# Patient Record
Sex: Female | Born: 1985 | Race: Black or African American | Hispanic: No | Marital: Married | Smoking: Never smoker
Health system: Southern US, Community
[De-identification: ages and names within clinical notes are randomized; demographics above are authoritative.]

## PROBLEM LIST (undated history)

## (undated) DIAGNOSIS — D649 Anemia, unspecified: Secondary | ICD-10-CM

## (undated) HISTORY — DX: Anemia, unspecified: D64.9

## (undated) HISTORY — PX: LEEP: SHX91

---

## 2014-03-30 DIAGNOSIS — O30009 Twin pregnancy, unspecified number of placenta and unspecified number of amniotic sacs, unspecified trimester: Secondary | ICD-10-CM | POA: Insufficient documentation

## 2017-11-24 ENCOUNTER — Ambulatory Visit: Payer: No Typology Code available for payment source | Attending: Family Medicine | Admitting: Physical Therapy

## 2017-11-24 ENCOUNTER — Encounter: Payer: Self-pay | Admitting: Physical Therapy

## 2017-11-24 ENCOUNTER — Other Ambulatory Visit: Payer: Self-pay

## 2017-11-24 DIAGNOSIS — R29898 Other symptoms and signs involving the musculoskeletal system: Secondary | ICD-10-CM | POA: Diagnosis present

## 2017-11-24 DIAGNOSIS — R278 Other lack of coordination: Secondary | ICD-10-CM | POA: Diagnosis not present

## 2017-11-24 DIAGNOSIS — M6281 Muscle weakness (generalized): Secondary | ICD-10-CM | POA: Insufficient documentation
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
## 2017-11-24 NOTE — Patient Instructions (Addendum)
  Decrease downward forces onto pelvic floor: Log roll out of bed instead of jumping out of bed with a crunch Hold off on crunches and sit ups  Exhale as you lift or as you get out of the chair (against gravity and loads)      Proper body mechanics with getting out of a chair to decrease strain  on back &pelvic floor   Avoid holding your breath when Getting out of the chair:  Scoot to front part of chair chair Heels behind feet, feet are hip width apart, nose over toes  Inhale like you are smelling roses Exhale to stand       Deep core strengthenign Level 1 and 2 ( handout)

## 2017-11-30 ENCOUNTER — Ambulatory Visit: Payer: No Typology Code available for payment source | Admitting: Physical Therapy

## 2017-11-30 DIAGNOSIS — M6281 Muscle weakness (generalized): Secondary | ICD-10-CM | POA: Diagnosis not present

## 2017-11-30 DIAGNOSIS — R29898 Other symptoms and signs involving the musculoskeletal system: Secondary | ICD-10-CM

## 2017-11-30 NOTE — Patient Instructions (Signed)
   Abdominal massage upward from L,  R , center to belly button 3 stroke x 3 , pressure is gentle and light with all fingers flat, not using finger tips   Open book ( handout)   Deep core level 1 ( focus on the natural lift of pelvic floor with exhale)  Deep core level 2   LOG ROLL TO SIDE COMPLETELY BEFORE OUT OF BED

## 2017-12-01 NOTE — Therapy (Addendum)
[place] [facility] [address] [place], [place], [address] Phone: [phone]   Fax:  [phone]  Physical Therapy Treatment  Patient Details  Name: Jennifer FellowsVanity Drake MRN: [id number] Date of Birth: May 14, 1985 Referring Provider: Tiney RougeKeely Godwin   Encounter Date: 11/30/2017  PT End of Session - 12/01/17 2239    Visit Number  2    Number of Visits  8    Date for PT Re-Evaluation  01/19/18    Authorization Type  VA authorization 15 visits before 01/24/18     PT Start Time  1600    PT Stop Time  1702    PT Time Calculation (min)  62 min       No past medical history on file.  Past Surgical History:  Procedure Laterality Date  . CESAREAN SECTION    . LEEP      There were no vitals filed for this visit.  Subjective Assessment - 11/30/17 1610    Subjective  Pt is working on breaking her old habits , rolling out of the bed and standing up.     Pertinent History  Hx of pull-ups, sit-ups, crunches. Pt has stopped performing her exercise routine. Denied injuries to hips,spine, and legs. Pt has 4 children, ( 389, 885 and 30 years old twins) .  2 vaginal deliveries with stitches  and 1 c -sections for twins.      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    Pelvic Floor Internal Exam  pt consented verbally without contraindications     Exam Type  Vaginal    Palpation  decreased activation on R pelvic floor, bearing down with abs, intitally superficial mm activation, post Tx achieved 4/5 but decreased activation on R     Strength  good  squeeze, good lift, able to hold agaisnt strong resistance    Strength # of reps  10    Strength # of seconds  1        [facility] Adult PT Treatment/Exercise - 12/01/17 2239      Neuro Re-ed    Neuro Re-ed Details   see pt instructions      Manual Therapy   Manual therapy comments  fascial release over R mons pubis/ bulbospongiosus,  quadriped position: fascial pulling of obliques to faciliate closure of DRA , fsacial release over C-section scar     Internal Pelvic Floor  facilitation of anterior mm R > L                  PT Long Term Goals - 11/24/17 1631      PT LONG TERM GOAL #1   Title  Pt will report less pain < 1/10 and will use proper lfitng. bending mechanics  when bending and lifting both toddler twins out of the bathroom in order to perform mother duties     Time  4    Period  Weeks    Status  New    Target Date  12/22/17      PT LONG TERM GOAL #2   Title  Pt will be able to cook for 30 mins without back pain in order to return ADLs     Time  8    Period  Weeks    Status  New    Target Date  01/19/18      PT LONG TERM GOAL #3   Title  Pt will IND with modifications and techniques with fitness routines  to ensure proper dep core activation and proper alignment to minimize injuries     Time  8    Period  Weeks    Status  New    Target Date  01/19/18      PT LONG TERM GOAL #4   Title  Pt will report improved stool consistency type will improve from type 1 ( 75%) to Type 4 ( 75%) in order to minimize straining of the pelvic floor    Time  8    Period  Weeks    Status  New    Target Date  01/19/18      PT LONG TERM GOAL #5   Title  Pt will decrease her PFDI score from 51% to <31 % in order to have less leakage and less difficulty with emptying bladder     Time  8    Period  Weeks    Status  New    Target Date  01/19/18      Additional Long Term Goals   Additional Long Term Goals  Yes      PT LONG TERM GOAL #6   Title  Pt will decrease her PDI  score from 13% to <8 % in order to minimize LBP and pelvic pain to return to ADLs.     Time  8    Period  Weeks    Status  New    Target Date  01/19/18      PT LONG TERM GOAL #7   Title  Pt will demo less fingers width with diastasis recti from 4 fingers to < 2 fingers in order to increased intraabdominal pressure to optimize motility for better bowel movements, postural stability for bending. lifting,     Time  8    Period  Weeks    Status  New    Target Date  01/19/18      PT LONG TERM GOAL #8   Title  Pt will decrease her ODI score from 23% to < 18% in order to minimize LBP and return to fitness     Time  8    Period  Weeks    Status  New    Target Date  01/19/18            Plan - 12/01/17 2242    Clinical Impression Statement  Pt demo'd decreased C-section scar restrictions R> L along with tightness at R bulbospongiosus, B anterior pelvic floor mm following Tx today. Pt had no complaints with Tx. Applied manual Tx to address DRA today which pt also tolerated without complaints. Pt demo'd improve pelvic floor mobility and proper deep core coordination postTx. Pt reported her back felt better with the Tx. Pt continues to benefit from skilled PT to make progress towards her goals.     Rehab Potential  Good    PT Frequency  2x / week   8weeks  PT Duration    PT Treatment/Interventions  Stair training;Functional mobility training;Moist Heat;Gait training;Therapeutic activities;Therapeutic exercise;Patient/family education;Neuromuscular re-education;Manual techniques;Energy conservation;Scar mobilization    Consulted and Agree with Plan of Care  Patient       Patient will benefit from skilled therapeutic intervention in order to improve the following deficits and impairments:  Improper body mechanics, Pain, Increased fascial restricitons, Increased muscle spasms, Postural dysfunction, Decreased scar mobility, Decreased mobility, Decreased coordination, Decreased activity  tolerance, Decreased endurance, Decreased range of motion, Impaired flexibility, Decreased balance, Decreased strength  Visit Diagnosis: Muscle weakness (generalized)  Other symptoms and signs involving the musculoskeletal system     Problem List There are no active problems to display for this patient.   Mariane Masters ,PT, DPT, E-RYT  12/01/2017, 10:46 PM  [facility] MAIN [facility] SERVICES [address] [place], [place], [address] Phone: [phone]   Fax:  [phone]  Name: Jennifer Drake MRN: [id number] Date of Birth: 1986/01/13

## 2017-12-02 ENCOUNTER — Ambulatory Visit: Payer: No Typology Code available for payment source | Admitting: Physical Therapy

## 2017-12-02 DIAGNOSIS — M6281 Muscle weakness (generalized): Secondary | ICD-10-CM | POA: Diagnosis not present

## 2017-12-02 DIAGNOSIS — R29898 Other symptoms and signs involving the musculoskeletal system: Secondary | ICD-10-CM

## 2017-12-02 NOTE — Therapy (Signed)
[place] [facility] [address] [place], [place], [address] Phone: [phone]   Fax:  [phone]  Physical Therapy Treatment  Patient Details  Name: Jennifer Drake MRN: [id number] Date of Birth: 08/11/1985 Referring Provider: Tiney RougeKeely Godwin   Encounter Date: 12/02/2017  PT End of Session - 12/02/17 1802    Visit Number  3    Number of Visits  8    Date for PT Re-Evaluation  01/19/18    Authorization Type  VA authorization 15 visits before 01/24/18     PT Start Time  1707    PT Stop Time  1802    PT Time Calculation (min)  55 min       No past medical history on file.  Past Surgical History:  Procedure Laterality Date  . CESAREAN SECTION    . LEEP      There were no vitals filed for this visit.  Subjective Assessment - 12/02/17 1721    Subjective  Pt reported she felt pain over the abdominal for several hours after the treatment at last session. It has subsided today. Pt tried the logrolling technique but has questions. Pt has difficulty falling asleep since her father died in May 2019. Pt uses her phone to fall sleep and takes melatonin. Pt finds her mind thinking and over thinking which keeps her up until 3am or later. Since she was a kid, she has had episodes where she wakes but her body can not move and her eyes won't open, her mind is awake.  It occurs once a month or less. Pt has not spoken to her MD about it yet. Pt reports feeling sleepy today     Pertinent History  Hx of pull-ups, sit-ups, crunches. Pt has stopped performing her exercise routine. Denied injuries to hips,spine, and legs. Pt has 4 children, ( 839, 85 and 30 years old twins) .  2 vaginal deliveries with stitches  and 1 c -sections for twins.      Patient Stated Goals  pain to subside and pelvic floor to get stronger and want to know more about diastasis recti          [facility] PT Assessment - 12/02/17 1807      Palpation   Palpation comment  tightness at  posterior/ anterior/ lateral  intercostals B       Bed Mobility   Bed Mobility  -- poor carry over, required 4 reps                 Pelvic Floor Special Questions - 12/02/17 1807    Diastasis Recti  903fingers width aboev umbilicus, 1 fingers width below sternum        [facility] Adult PT Treatment/Exercise - 12/02/17 1807      Therapeutic Activites    Therapeutic Activities  -- see pt instructions, sleep hygiene with relaxation technique      Manual Therapy   Manual therapy comments  fascial release over lower ribs for improve depression and excursion of ribcage for DRA, STM at posterior intercostals               PT Education - 12/02/17 1749    Education Details  HEP    Person(s) Educated  Patient    Methods  Explanation;Demonstration;Verbal cues;Handout    Comprehension  Returned demonstration;Verbalized understanding          PT Long Term Goals - 12/02/17 1803      PT LONG TERM GOAL #  1   Title  Pt will report less pain < 1/10 and will use proper lfitng. bending mechanics  when bending and lifting both toddler twins out of the bathroom in order to perform mother duties     Time  4    Period  Weeks    Status  New      PT LONG TERM GOAL #2   Title  Pt will be able to cook for 30 mins without back pain in order to return ADLs     Time  8    Period  Weeks    Status  New      PT LONG TERM GOAL #3   Title  Pt will IND with modifications and techniques with fitness routines  to ensure proper dep core activation and proper alignment to minimize injuries     Time  8    Period  Weeks    Status  New      PT LONG TERM GOAL #4   Title  Pt will report improved stool consistency type will improve from type 1 ( 75%) to Type 4 ( 75%) in order to minimize straining of the pelvic floor    Time  8    Period  Weeks    Status  New      PT LONG TERM GOAL #5   Title  Pt will decrease her PFDI score from 51% to <31 % in order to have less leakage and less difficulty with  emptying bladder     Time  8    Period  Weeks    Status  New      Additional Long Term Goals   Additional Long Term Goals  Yes      PT LONG TERM GOAL #6   Title  Pt will decrease her PDI score from 13% to <8 % in order to minimize LBP and pelvic pain to return to ADLs.     Time  8    Period  Weeks    Status  New      PT LONG TERM GOAL #7   Title  Pt will demo less fingers width with diastasis recti from 4 fingers to < 2 fingers in order to increased intraabdominal pressure to optimize motility for better bowel movements, postural stability for bending. lifting,     Time  8    Period  Weeks    Status  New      PT LONG TERM GOAL #8   Title  Pt will decrease her ODI score from 23% to < 18% in order to minimize LBP and return to fitness     Time  8    Period  Weeks    Status  New      PT LONG TERM GOAL  #9   TITLE  Pt will decrease her PSQI score from 90% to < 50% in order to improve sleep quality for improved outcomes for learning at PT sessions and overall health    Time  12    Period  Weeks    Status  New    Target Date  02/24/18            Plan - 12/02/17 1803    Clinical Impression Statement  Pt showed less bulging of abdominal mm today and less separation of DRA. Applied manual Tx to facilitate more diaphragmatic excursion and depression.  Pt continued to show improved coordination of deep core mm. Added relaxation and sleep  hygiene education to promote sleep quality and overall wellness. Advised pt to contact her PCP about her questions and concerns about getting sleep study to screen for OSA. Pt voiced understanding. Pt responded well to relaxation training as pt reported feeling more relaxed like she could fall sleep. Pt was recommended to utilize pelvic PT exercise sas pre bedtime and morning routine. Pt continues to benefit from skilled PT.       Rehab Potential  Good    PT Frequency  2x / week    PT Duration 8weeks    PT Treatment/Interventions  Stair  training;Functional mobility training;Moist Heat;Gait training;Therapeutic activities;Therapeutic exercise;Patient/family education;Neuromuscular re-education;Manual techniques;Energy conservation;Scar mobilization    Consulted and Agree with Plan of Care  Patient       Patient will benefit from skilled therapeutic intervention in order to improve the following deficits and impairments:  Improper body mechanics, Pain, Increased fascial restricitons, Increased muscle spasms, Postural dysfunction, Decreased scar mobility, Decreased mobility, Decreased coordination, Decreased activity tolerance, Decreased endurance, Decreased range of motion, Impaired flexibility, Decreased balance, Decreased strength  Visit Diagnosis: Muscle weakness (generalized)  Other symptoms and signs involving the musculoskeletal system     Problem List There are no active problems to display for this patient.   Mariane Masters ,PT, DPT, E-RYT  12/02/2017, 6:15 PM  [place] [facility] MAIN [facility] SERVICES [address] [place], [place], [address] Phone: [phone]   Fax:  [phone]  Name: Jennifer Drake MRN: [id number] Date of Birth: 10-17-1985

## 2017-12-02 NOTE — Patient Instructions (Addendum)
To improve sleep hygiene  Discontinue the use of phone and TV 2 hours before bed   Morning and night time routine  DoLast session's exercises Open book ( drag and not lift to avoid neck tightness)  deepcore level 1 and 2   Handout for gentle movements through the body   Body scan ( audio file emailed)

## 2017-12-02 NOTE — Addendum Note (Signed)
Addended by: Mariane MastersYEUNG, SHIN-YIING on: 12/02/2017 04:34 PM   Modules accepted: Orders

## 2017-12-07 ENCOUNTER — Ambulatory Visit: Payer: No Typology Code available for payment source | Admitting: Physical Therapy

## 2017-12-07 DIAGNOSIS — M6281 Muscle weakness (generalized): Secondary | ICD-10-CM

## 2017-12-07 DIAGNOSIS — R29898 Other symptoms and signs involving the musculoskeletal system: Secondary | ICD-10-CM

## 2017-12-07 NOTE — Patient Instructions (Addendum)
Loosening up midback  Open book   Strengthening hips  Clam Shell 45 Degrees   Lying with hips and knees bent 45, one pillow between knees and ankles. Lift knee with exhale. Be sure pelvis does not roll backward. Do not arch back. Do 10 times, each leg, 2 times per day.  http://ss.exer.us/75   Copyright  VHI. All rights reserved.     Complimentary stretch: Figure -4 stretch 5 breaths    _________  Deep core 1 Refine Deep core level 1 breathing with not letting stomach muscle push down, just allow pelvic floor lift , belly sink, chest sink  Inhale 1-2 pause Exhale 2-1 -pause Quick pelvic floor  squeeze after exhale   Deep core level 2  -6 min  __________   Stretches for back :  childs pose -3 ways   at the counter - 3ways  5 reps   Stretch for the neck: Pull the arm down towards feet, turn neck opposite side  5 reps   ____________  Urination:  Apply breathing technique to relax pelvic floor  but donot stop midstream

## 2017-12-08 NOTE — Therapy (Signed)
[place] [facility] MAIN [facility] SERVICES [address] [place], [place], [address] Phone: [phone]   Fax:  [phone]  Physical Therapy Treatment  Patient Details  Name: Jennifer Drake MRN: [id number] Date of Birth: [birth year] Referring Provider: Tiney Rouge   Encounter Date: 12/07/2017    No past medical history on file.  Past Surgical History:  Procedure Laterality Date  . CESAREAN SECTION    . LEEP      There were no vitals filed for this visit.  Subjective Assessment - 12/07/17 1609    Subjective  Pt tried doing her exercises twice the past week. Pt had stopped doing the exercises from a app " Lose Belly Fat". Pt would like to return to doing the elliptical . Pt is still having difficulty emptying all the way with urination     Pertinent History  Hx of pull-ups, sit-ups, crunches. Pt has stopped performing her exercise routine. Denied injuries to hips,spine, and legs. Pt has 4 children, ( 21, 35 and 40 years old twins) .  2 vaginal deliveries with stitches  and 1 c -sections for twins.      Patient Stated Goals  pain to subside and pelvic floor to get stronger and want to know more about diastasis recti          [facility] PT Assessment - 12/08/17 0914      Coordination   Gross Motor Movements are Fluid and Coordinated  -- oblique overuse w/ exhalation, bearing down of pelvic floor      Palpation   Palpation comment  increased tensions at B paraspinals, posterior intercostals  upper trap/ levator                 Pelvic Floor Special Questions - 12/08/17 0915    Diastasis Recti  2.5 fingers above umbilicus, 1 finger below sternum          [facility] Adult PT Treatment/Exercise - 12/08/17 0915      Neuro Re-ed    Neuro Re-ed Details   see pt instructions      Manual Therapy   Manual therapy comments  L T10/12 posterior intercostal mm STM and paraspinal STM B and upper trap/ levator B              PT Education - 12/07/17 1704     Education Details  HEP    Person(s) Educated  Patient    Methods  Explanation;Demonstration;Tactile cues;Verbal cues;Handout    Comprehension  Returned demonstration;Verbalized understanding;Verbal cues required          PT Long Term Goals - 12/02/17 1803      PT LONG TERM GOAL #1   Title  Pt will report less pain < 1/10 and will use proper lfitng. bending mechanics  when bending and lifting both toddler twins out of the bathroom in order to perform mother duties     Time  4    Period  Weeks    Status  New      PT LONG TERM GOAL #2   Title  Pt will be able to cook for 30 mins without back pain in order to return ADLs     Time  8    Period  Weeks    Status  New      PT LONG TERM GOAL #3   Title  Pt will IND with modifications and techniques with fitness routines  to ensure proper dep core activation and proper alignment to minimize injuries  Time  8    Period  Weeks    Status  New      PT LONG TERM GOAL #4   Title  Pt will report improved stool consistency type will improve from type 1 ( 75%) to Type 4 ( 75%) in order to minimize straining of the pelvic floor    Time  8    Period  Weeks    Status  New      PT LONG TERM GOAL #5   Title  Pt will decrease her PFDI score from 51% to <31 % in order to have less leakage and less difficulty with emptying bladder     Time  8    Period  Weeks    Status  New      Additional Long Term Goals   Additional Long Term Goals  Yes      PT LONG TERM GOAL #6   Title  Pt will decrease her PDI score from 13% to <8 % in order to minimize LBP and pelvic pain to return to ADLs.     Time  8    Period  Weeks    Status  New      PT LONG TERM GOAL #7   Title  Pt will demo less fingers width with diastasis recti from 4 fingers to < 2 fingers in order to increased intraabdominal pressure to optimize motility for better bowel movements, postural stability for bending. lifting,     Time  8    Period  Weeks    Status  New      PT LONG  TERM GOAL #8   Title  Pt will decrease her ODI score from 23% to < 18% in order to minimize LBP and return to fitness     Time  8    Period  Weeks    Status  New      PT LONG TERM GOAL  #9   TITLE  Pt will decrease her PSQI score from 90% to < 50% in order to improve sleep quality for improved outcomes for learning at PT sessions and overall health    Time  12    Period  Weeks    Status  New    Target Date  02/24/18            Plan - 12/08/17 0916    Clinical Impression Statement  Pt continues to make progress with less separation of diastasis recti. Progressed with decreasing mm tensions at the thoracic region and hip strengthening HEP. Pt required more cues to minimize overuse of oblique mm with exhalation and minimize downward forces onto pelvic floor and organs. Pt demo'd correct technique post training with verbal , tactile , and visual cues,  Anticipate pt's corrected technique will help with pt's difficulty with completely emptying of urine and lowered position of her bladder.  Pt continues to benefit from skilled PT.     Rehab Potential  Good    PT Frequency  2x / week    PT Duration  8 weeks    PT Treatment/Interventions  Stair training;Functional mobility training;Moist Heat;Gait training;Therapeutic activities;Therapeutic exercise;Patient/family education;Neuromuscular re-education;Manual techniques;Energy conservation;Scar mobilization    Consulted and Agree with Plan of Care  Patient       Patient will benefit from skilled therapeutic intervention in order to improve the following deficits and impairments:  Improper body mechanics, Pain, Increased fascial restricitons, Increased muscle spasms, Postural dysfunction, Decreased scar mobility, Decreased mobility, Decreased  coordination, Decreased activity tolerance, Decreased endurance, Decreased range of motion, Impaired flexibility, Decreased balance, Decreased strength  Visit Diagnosis: Muscle weakness  (generalized)  Other symptoms and signs involving the musculoskeletal system     Problem List There are no active problems to display for this patient.   Mariane Masters 12/08/2017, 9:20 AM  [place] [facility] MAIN [facility] SERVICES [address] [place], [place], [address] Phone: [phone]   Fax:  [phone]  Name: Jennifer Drake MRN: [id number] Date of Birth: 1985/11/25

## 2017-12-08 NOTE — Therapy (Signed)
[place] [facility] MAIN [facility] SERVICES [address] [place], [place], [address] Phone: [phone]   Fax:  [phone]  Physical Therapy Treatment  Patient Details  Name: Jennifer Drake MRN: [id number] Date of Birth: 19-Oct-1985 Referring Provider: Tiney Rouge   Encounter Date: 12/07/2017    No past medical history on file.  Past Surgical History:  Procedure Laterality Date  . CESAREAN SECTION    . LEEP      There were no vitals filed for this visit.  Subjective Assessment - 12/07/17 1609    Subjective  Pt tried doing her exercises twice the past week. Pt had stopped doing the exercises from a app " Lose Belly Fat". Pt would like to return to doing the elliptical . Pt is still having difficulty emptying all the way with urination     Pertinent History  Hx of pull-ups, sit-ups, crunches. Pt has stopped performing her exercise routine. Denied injuries to hips,spine, and legs. Pt has 4 children, ( 74, 81 and 80 years old twins) .  2 vaginal deliveries with stitches  and 1 c -sections for twins.      Patient Stated Goals  pain to subside and pelvic floor to get stronger and want to know more about diastasis recti          [facility] PT Assessment - 12/08/17 0914      Coordination   Gross Motor Movements are Fluid and Coordinated  -- oblique overuse w/ exhalation, bearing down of pelvic floor      Palpation   Palpation comment  increased tensions at B paraspinals, posterior intercostals  upper trap/ levator                 Pelvic Floor Special Questions - 12/08/17 0915    Diastasis Recti  2.5 fingers above umbilicus, 1 finger below sternum          [facility] Adult PT Treatment/Exercise - 12/08/17 0915      Neuro Re-ed    Neuro Re-ed Details   see pt instructions      Manual Therapy   Manual therapy comments  L T10/12 posterior intercostal mm STM and paraspinal STM B and upper trap/ levator B              PT Education - 12/07/17 1704     Education Details  HEP    Person(s) Educated  Patient    Methods  Explanation;Demonstration;Tactile cues;Verbal cues;Handout    Comprehension  Returned demonstration;Verbalized understanding;Verbal cues required          PT Long Term Goals - 12/02/17 1803      PT LONG TERM GOAL #1   Title  Pt will report less pain < 1/10 and will use proper lfitng. bending mechanics  when bending and lifting both toddler twins out of the bathroom in order to perform mother duties     Time  4    Period  Weeks    Status  New      PT LONG TERM GOAL #2   Title  Pt will be able to cook for 30 mins without back pain in order to return ADLs     Time  8    Period  Weeks    Status  New      PT LONG TERM GOAL #3   Title  Pt will IND with modifications and techniques with fitness routines  to ensure proper dep core activation and proper alignment to minimize injuries  Time  8    Period  Weeks    Status  New      PT LONG TERM GOAL #4   Title  Pt will report improved stool consistency type will improve from type 1 ( 75%) to Type 4 ( 75%) in order to minimize straining of the pelvic floor    Time  8    Period  Weeks    Status  New      PT LONG TERM GOAL #5   Title  Pt will decrease her PFDI score from 51% to <31 % in order to have less leakage and less difficulty with emptying bladder     Time  8    Period  Weeks    Status  New      Additional Long Term Goals   Additional Long Term Goals  Yes      PT LONG TERM GOAL #6   Title  Pt will decrease her PDI score from 13% to <8 % in order to minimize LBP and pelvic pain to return to ADLs.     Time  8    Period  Weeks    Status  New      PT LONG TERM GOAL #7   Title  Pt will demo less fingers width with diastasis recti from 4 fingers to < 2 fingers in order to increased intraabdominal pressure to optimize motility for better bowel movements, postural stability for bending. lifting,     Time  8    Period  Weeks    Status  New      PT LONG  TERM GOAL #8   Title  Pt will decrease her ODI score from 23% to < 18% in order to minimize LBP and return to fitness     Time  8    Period  Weeks    Status  New      PT LONG TERM GOAL  #9   TITLE  Pt will decrease her PSQI score from 90% to < 50% in order to improve sleep quality for improved outcomes for learning at PT sessions and overall health    Time  12    Period  Weeks    Status  New    Target Date  02/24/18            Plan - 12/08/17 0916    Clinical Impression Statement  Pt continues to make progress with less separation of diastasis recti. Progressed with decreasing mm tensions at the thoracic region and hip strengthening HEP. manual Tx was applied to minimize pt's neck complaints with open book exercise.  Pt was able to perform HEP without complaints. Pt required more cues to minimize overuse of oblique mm with exhalation and minimize downward forces onto pelvic floor and organs. Pt demo'd correct technique post training with verbal , tactile , and visual cues,  Anticipate pt's corrected technique will help with pt's difficulty with completely emptying of urine and lowered position of her bladder.  Pt continues to benefit from skilled PT.     Rehab Potential  Good    PT Frequency  2x / week    PT Duration  8 weeks    PT Treatment/Interventions  Stair training;Functional mobility training;Moist Heat;Gait training;Therapeutic activities;Therapeutic exercise;Patient/family education;Neuromuscular re-education;Manual techniques;Energy conservation;Scar mobilization    Consulted and Agree with Plan of Care  Patient       Patient will benefit from skilled therapeutic intervention in order to improve the following  deficits and impairments:  Improper body mechanics, Pain, Increased fascial restricitons, Increased muscle spasms, Postural dysfunction, Decreased scar mobility, Decreased mobility, Decreased coordination, Decreased activity tolerance, Decreased endurance, Decreased range  of motion, Impaired flexibility, Decreased balance, Decreased strength  Visit Diagnosis: Muscle weakness (generalized)  Other symptoms and signs involving the musculoskeletal system     Problem List There are no active problems to display for this patient.   Mariane Masters 12/08/2017, 9:20 AM  [place] [facility] MAIN [facility] SERVICES [address] [place], [place], [address] Phone: [phone]   Fax:  [phone]  Name: Anola Mcgough MRN: [id number] Date of Birth: 1985/07/29

## 2017-12-09 ENCOUNTER — Ambulatory Visit: Payer: No Typology Code available for payment source | Admitting: Physical Therapy

## 2017-12-14 ENCOUNTER — Ambulatory Visit: Payer: No Typology Code available for payment source | Admitting: Physical Therapy

## 2017-12-16 ENCOUNTER — Ambulatory Visit: Payer: No Typology Code available for payment source | Attending: Family Medicine | Admitting: Physical Therapy

## 2017-12-16 DIAGNOSIS — R29898 Other symptoms and signs involving the musculoskeletal system: Secondary | ICD-10-CM | POA: Diagnosis present

## 2017-12-16 DIAGNOSIS — M6281 Muscle weakness (generalized): Secondary | ICD-10-CM | POA: Diagnosis not present

## 2017-12-16 NOTE — Patient Instructions (Addendum)
Standing posture: soft and unlocked knees when standing  Exhale to feel the belly hug in Do not hold your stomach in all day    "Pulling seat belt"   Band placed in a "U" on the floor,  Feet on band,  Hold R band in L hand,  Keep knees parallel hip width apart,  Upper arms firm, keep elbow by side Keep wrist position neutral when pulling  Exhale to pull bandcross body 10 x 2 reps    _________   Modified thread the needle: hands at the counter, trunk parallel. Knees slightly bent Lengthen spine, 3 breaths. Then R hand on L thigh 3 breaths  Switch sides    _________   Sideplank modified with calm shells  Forearm/ fist on the floor elbow slightly ahead of shoulder,  Elbow, hips, heels all aligned Other hand on floor for support Make sure both sides of the trunk stay lengthened Thighs are at 120 deg from trunk, heels in line with tailbone Inhale, exhale, lift knee up slightly before the point of rocking hips back 10 x   Switch sides

## 2017-12-17 NOTE — Therapy (Addendum)
[place] MAIN [facility] SERVICES [address] [place], [place], [address] Phone: [phone]   Fax:  [phone]  Physical Therapy Treatment  Patient Details  Name: Jennifer Drake MRN: [id number] Date of Birth: 04-02-1986 Referring Provider: Jetta Lout   Encounter Date: 12/16/2017  PT End of Session - 12/17/17 1758    Visit Number  4    Number of Visits  8    Date for PT Re-Evaluation  01/19/18    Authorization Type  [facility] authorization 15 visits before 01/24/18     PT Start Time  1603    PT Stop Time  1656    PT Time Calculation (min)  53 min    Activity Tolerance  Patient tolerated treatment well;No increased pain    Behavior During Therapy  [facility] for tasks assessed/performed       No past medical history on file.  Past Surgical History:  Procedure Laterality Date  . CESAREAN SECTION    . LEEP      There were no vitals filed for this visit.  Subjective Assessment - 12/16/17 1612    Subjective  Pt reported she had very little back pain last week and was able to wash her dtr's hair with her back turned. Normally this motion had hurt her back.      Pertinent History  Hx of pull-ups, sit-ups, crunches. Pt has stopped performing her exercise routine. Denied injuries to hips,spine, and legs. Pt has 4 children, ( 75, 91 and 22 years old twins) .  2 vaginal deliveries with stitches  and 1 c -sections for twins.      Patient Stated Goals  pain to subside and pelvic floor to get stronger and want to know more about diastasis recti          [facility] PT Assessment - 12/16/17 1628      Coordination   Gross Motor Movements are Fluid and Coordinated  --   limited diaphragmatic excursion,posterior intercostal mobili   Fine Motor Movements are Fluid and Coordinated  --   excessive ab expansion      Posture/Postural Control   Posture Comments  hyperextended knees, anterior tilt of pelvis       ROM / Strength   AROM / PROM / Strength  --   hip abd/ ext B  3+/5                   [facility] Adult PT Treatment/Exercise - 12/16/17 1629      Neuro Re-ed    Neuro Re-ed Details   see pt instructions      Exercises   Exercises  --   see pt instructions     Manual Therapy   Manual therapy comments  posterior intercostal mm B lower ribs to faciliate rotation with less lumbar lordosis                   PT Long Term Goals - 12/16/17 1701      PT LONG TERM GOAL #1   Title  Pt will report less pain < 1/10 and will use proper lfitng. bending mechanics  when bending and lifting both toddler twins out of the bathroom in order to perform mother duties     Time  4    Period  Weeks    Status  Achieved      PT LONG TERM GOAL #2   Title  Pt will be able to cook for 30 mins without  back pain in order to return ADLs     Time  8    Period  Weeks    Status  On-going      PT LONG TERM GOAL #3   Title  Pt will IND with modifications and techniques with fitness routines  to ensure proper dep core activation and proper alignment to minimize injuries     Time  8    Period  Weeks    Status  On-going      PT LONG TERM GOAL #4   Title  Pt will report improved stool consistency type will improve from type 1 ( 75%) to Type 4 ( 75%) in order to minimize straining of the pelvic floor    Time  8    Period  Weeks    Status  On-going      PT LONG TERM GOAL #5   Title  Pt will decrease her PFDI score from 51% to <31 % in order to have less leakage and less difficulty with emptying bladder     Time  8    Period  Weeks    Status  On-going      Additional Long Term Goals   Additional Long Term Goals  Yes      PT LONG TERM GOAL #6   Title  Pt will decrease her Bath score from 13% to <8 % in order to minimize LBP and pelvic pain to return to ADLs.     Time  8    Period  Weeks    Status  On-going      PT LONG TERM GOAL #7   Title  Pt will demo less fingers width with diastasis recti from 4 fingers to < 2 fingers in order to increased  intraabdominal pressure to optimize motility for better bowel movements, postural stability for bending. lifting,     Time  8    Period  Weeks    Status  Partially Met      PT LONG TERM GOAL #8   Title  Pt will decrease her ODI score from 23% to < 18% in order to minimize LBP and return to fitness     Time  8    Period  Weeks    Status  New      PT LONG TERM GOAL  #9   TITLE  Pt will decrease her PSQI score from 90% to < 50% in order to improve sleep quality for improved outcomes for learning at PT sessions and overall health    Time  12    Period  Weeks    Status  On-going            Plan - 12/17/17 1758    Clinical Impression Statement  Pt continues to show decreased DRA and progressed to oblique strengthening today with proper alignment and technique following training. Pt continues to benefit from skilled PT.  Plan to address urinary issues, bladder irritant education at next session   Rehab Potential  Good    PT Frequency  2x / week    PT Duration  8 weeks    PT Treatment/Interventions  Stair training;Functional mobility training;Moist Heat;Gait training;Therapeutic activities;Therapeutic exercise;Patient/family education;Neuromuscular re-education;Manual techniques;Energy conservation;Scar mobilization    Consulted and Agree with Plan of Care  Patient       Patient will benefit from skilled therapeutic intervention in order to improve the following deficits and impairments:  Improper body mechanics, Pain, Increased fascial restricitons, Increased muscle  spasms, Postural dysfunction, Decreased scar mobility, Decreased mobility, Decreased coordination, Decreased activity tolerance, Decreased endurance, Decreased range of motion, Impaired flexibility, Decreased balance, Decreased strength  Visit Diagnosis: Muscle weakness (generalized)  Other symptoms and signs involving the musculoskeletal system     Problem List There are no active problems to display for this  patient.   Jerl Mina ,PT, DPT, E-RYT  12/17/2017, 6:00 PM  [place] MAIN [facility] SERVICES [address] [place], [place], [address] Phone: [phone]   Fax:  [phone]  Name: Jennifer Drake MRN: [id number] Date of Birth: 1985/05/24

## 2017-12-21 ENCOUNTER — Ambulatory Visit: Payer: No Typology Code available for payment source | Admitting: Physical Therapy

## 2017-12-23 ENCOUNTER — Ambulatory Visit: Payer: No Typology Code available for payment source | Admitting: Physical Therapy

## 2017-12-23 DIAGNOSIS — M6281 Muscle weakness (generalized): Secondary | ICD-10-CM | POA: Diagnosis not present

## 2017-12-23 DIAGNOSIS — R29898 Other symptoms and signs involving the musculoskeletal system: Secondary | ICD-10-CM

## 2017-12-23 NOTE — Therapy (Signed)
[place] MAIN [facility] SERVICES [address] [place], [place], [address] Phone: [phone]   Fax:  [phone]  Physical Therapy Treatment  Patient Details  Name: Jennifer Drake MRN: [id number] Date of Birth: 1985-12-20 Referring Provider: Jetta Lout   Encounter Date: 12/23/2017  PT End of Session - 12/23/17 1651    Visit Number  5    Number of Visits  8    Date for PT Re-Evaluation  01/19/18    Authorization Type  VA authorization 15 visits before 01/24/18     PT Start Time  1603    PT Stop Time  1651    PT Time Calculation (min)  48 min    Activity Tolerance  Patient tolerated treatment well;No increased pain    Behavior During Therapy  [facility] for tasks assessed/performed       No past medical history on file.  Past Surgical History:  Procedure Laterality Date  . CESAREAN SECTION    . LEEP      There were no vitals filed for this visit.  Subjective Assessment - 12/23/17 1646    Subjective  Pt has less pain with her back. However, she still notices when she is leaning over the tub bathing her kids, sitting in her bed while using her laptop or sitting on the couch.      Pertinent History  Hx of pull-ups, sit-ups, crunches. Pt has stopped performing her exercise routine. Denied injuries to hips,spine, and legs. Pt has 4 children, ( 78, 49 and 40 years old twins) .  2 vaginal deliveries with stitches  and 1 c -sections for twins.      Patient Stated Goals  pain to subside and pelvic floor to get stronger and want to know more about diastasis recti          [facility] PT Assessment - 12/23/17 1701      Observation/Other Assessments   Observations  rounded shoulders,                 Pelvic Floor Special Questions - 12/23/17 1700    Diastasis Recti  2.5 fingers above umbilicus        [facility] Adult PT Treatment/Exercise - 12/23/17 1648      Therapeutic Activites    Therapeutic Activities  --   see pt instructions, sleep hygiene  with relaxation technique     Neuro Re-ed    Neuro Re-ed Details   see pt instructions             PT Education - 12/23/17 1651    Education Details  HEP    Person(s) Educated  Patient    Methods  Demonstration;Explanation;Tactile cues;Verbal cues;Handout    Comprehension  Returned demonstration;Verbalized understanding;Verbal cues required;Tactile cues required          PT Long Term Goals - 12/16/17 1701      PT LONG TERM GOAL #1   Title  Pt will report less pain < 1/10 and will use proper lfitng. bending mechanics  when bending and lifting both toddler twins out of the bathroom in order to perform mother duties     Time  4    Period  Weeks    Status  Achieved      PT LONG TERM GOAL #2   Title  Pt will be able to cook for 30 mins without back pain in order to return ADLs     Time  8    Period  Weeks    Status  On-going      PT LONG TERM GOAL #3   Title  Pt will IND with modifications and techniques with fitness routines  to ensure proper dep core activation and proper alignment to minimize injuries     Time  8    Period  Weeks    Status  On-going      PT LONG TERM GOAL #4   Title  Pt will report improved stool consistency type will improve from type 1 ( 75%) to Type 4 ( 75%) in order to minimize straining of the pelvic floor    Time  8    Period  Weeks    Status  On-going      PT LONG TERM GOAL #5   Title  Pt will decrease her PFDI score from 51% to <31 % in order to have less leakage and less difficulty with emptying bladder     Time  8    Period  Weeks    Status  On-going      Additional Long Term Goals   Additional Long Term Goals  Yes      PT LONG TERM GOAL #6   Title  Pt will decrease her [place] score from 13% to <8 % in order to minimize LBP and pelvic pain to return to ADLs.     Time  8    Period  Weeks    Status  On-going      PT LONG TERM GOAL #7   Title  Pt will demo less fingers width with diastasis recti from 4 fingers to < 2 fingers in  order to increased intraabdominal pressure to optimize motility for better bowel movements, postural stability for bending. lifting,     Time  8    Period  Weeks    Status  Partially Met      PT LONG TERM GOAL #8   Title  Pt will decrease her ODI score from 23% to < 18% in order to minimize LBP and return to fitness     Time  8    Period  Weeks    Status  New      PT LONG TERM GOAL  #9   TITLE  Pt will decrease her PSQI score from 90% to < 50% in order to improve sleep quality for improved outcomes for learning at PT sessions and overall health    Time  12    Period  Weeks    Status  On-going            Plan - 12/23/17 1654    Clinical Impression Statement  Advanced outer core and thoracolumbar/lower kinetic chain strengthening today. Pt required tactile and verbal cues to increase scapular retraction/depression with exercises. Pt demo'd proper technique post training. Addressed body mechanics training with functional activities to minimize DRA and back pain.   Plan to reassess pelvic floor at next session and address urinary frequency. Pt continues to benefit from skilled PT.    Rehab Potential  Good    PT Frequency  2x / week    PT Duration  8 weeks    PT Treatment/Interventions  Stair training;Functional mobility training;Moist Heat;Gait training;Therapeutic activities;Therapeutic exercise;Patient/family education;Neuromuscular re-education;Manual techniques;Energy conservation;Scar mobilization    Consulted and Agree with Plan of Care  Patient       Patient will benefit from skilled therapeutic intervention in order to improve the following deficits and impairments:  Improper body mechanics, Pain,  Increased fascial restricitons, Increased muscle spasms, Postural dysfunction, Decreased scar mobility, Decreased mobility, Decreased coordination, Decreased activity tolerance, Decreased endurance, Decreased range of motion, Impaired flexibility, Decreased balance, Decreased  strength  Visit Diagnosis: Muscle weakness (generalized)  Other symptoms and signs involving the musculoskeletal system     Problem List There are no active problems to display for this patient.   Jerl Mina ,PT, DPT, E-RYT  12/23/2017, 5:03 PM  [place] MAIN [facility] SERVICES [address] [place], [place], [address] Phone: [phone]   Fax:  [phone]  Name: Jennifer Drake MRN: [id number] Date of Birth: 03-Jul-1985

## 2017-12-23 NOTE — Patient Instructions (Signed)
Bridging series w/ resistive band other side of doorknob:  Level 1:  Position:  Elbows bent, knees hip width apart, heels under knees on top of stable  foot stool   Stabilization points: shoulders, upper arms, back of head pressed into floor. Heel press downward.   Movement: inhale do nothing, exhale pull band by side, lower fists to floor completely while lifting hips.Keep stabilization points engaged when you allow the band to go back to starting position  10 x 2 reps       Level 2:  Position:  Elbows straight, arms raised to ceiling at shoulder height, knees apart like a ballerina,heels together, heels under knees, on top of stable  foot stool   Stabilization points: shoulders, upper arms, back of head pressed into floor. Heel press downward.   Movement: inhale do nothing, exhale pull band by side, lower fists to floor completely while lifting hips. Keep stabilization points engaged when you allow the band to go back to starting position   10 x 2 reps  Shoulder training: Try to imagine you are squeezing a pencil under your armpit and your shoulder blades are down away from your ears and towards each other     ___   Body mechanics  -use stool with bathing kids to avoid leaning over tub  - sit at your desk not your bed when using laptop -place a Large 3-ring binder under laptop to raise screen higher for less forward head posture  -placing pillows behind back on the couch

## 2017-12-28 ENCOUNTER — Ambulatory Visit: Payer: No Typology Code available for payment source | Admitting: Physical Therapy

## 2017-12-30 ENCOUNTER — Ambulatory Visit: Payer: No Typology Code available for payment source | Admitting: Physical Therapy

## 2017-12-30 DIAGNOSIS — M6281 Muscle weakness (generalized): Secondary | ICD-10-CM | POA: Diagnosis not present

## 2017-12-30 DIAGNOSIS — R29898 Other symptoms and signs involving the musculoskeletal system: Secondary | ICD-10-CM

## 2017-12-30 NOTE — Patient Instructions (Addendum)
PELVIC FLOOR / KEGEL EXERCISES   Pelvic floor/ Kegel exercises are used to strengthen the muscles in the base of your pelvis that are responsible for supporting your pelvic organs and preventing urine/feces leakage. Based on your therapist's recommendations, they can be performed while standing, sitting, or lying down. Imagine pelvic floor area as a diamond with pelvic landmarks: top =pubic bone, bottom tip=tailbone, sides=sitting bones (ischial tuberosities).    Make yourself aware of this muscle group by using these cues while coordinating your breath:  Inhale, feel pelvic floor diamond area lower like hammock towards your feet and ribcage/belly expanding. Pause. Let the exhale naturally and feel your belly sink, abdominal muscles hugging in around you and you may notice the pelvic diamond draws upward towards your head forming a umbrella shape. Give a squeeze during the exhalation like you are stopping the flow of urine. If you are squeezing the buttock muscles, try to give 50% less effort.   Common Errors:  Breath holding: If you are holding your breath, you may be bearing down against your bladder instead of pulling it up. If you belly bulges up while you are squeezing, you are holding your breath. Be sure to breathe gently in and out while exercising. Counting out loud may help you avoid holding your breath.  Accessory muscle use: You should not see or feel other muscle movement when performing pelvic floor exercises. When done properly, no one can tell that you are performing the exercises. Keep the buttocks, belly and inner thighs relaxed.  Overdoing it: Your muscles can fatigue and stop working for you if you over-exercise. You may actually leak more or feel soreness at the lower abdomen or rectum.  YOUR HOME EXERCISE PROGRAM     SHORT HOLDS: Position: on back, sitting   Inhale and then exhale. Then squeeze the muscle.  (Be sure to let belly sink in with exhales and not push  outward)  Perform 5 repetitions, 5  Times/day                      DECREASE DOWNWARD PRESSURE ON  YOUR PELVIC FLOOR, ABDOMINAL, LOW BACK MUSCLES       PRESERVE YOUR PELVIC HEALTH LONG-TERM   ** SQUEEZE pelvic floor BEFORE YOUR SNEEZE, COUGH, LAUGH   ** EXHALE BEFORE YOU RISE AGAINST GRAVITY (lifting, sit to stand, from squat to stand)   ** LOG ROLL OUT OF BED INSTEAD OF CRUNCH/SIT-UP    _________  Bridging series w/ resistive band other side of doorknob:  Level 1:  Position:  Elbows bent, knees hip width apart, heels under knees on top of stable  foot stool   Stabilization points: shoulders, upper arms, back of head pressed into floor. Heel press downward.   Movement: inhale do nothing, exhale pull band by side, lower fists to floor completely while lifting hips.Keep stabilization points engaged when you allow the band to go back to starting position  10 x 2 reps       Level 2:  Position:  Elbows straight, arms raised to ceiling at shoulder height, knees apart like a ballerina,heels together, heels under knees, on top of stable  foot stool   Stabilization points: shoulders, upper arms, back of head pressed into floor. Heel press downward.   Movement: inhale do nothing, exhale pull band by side, lower fists to floor completely while lifting hips. Keep stabilization points engaged when you allow the band to go back to starting position   10 x  2 reps  Shoulder training: Try to imagine you are squeezing a pencil under your armpit and your shoulder blades are down away from your ears and towards each other     ______   Multifidis twist  Band is on doorknob: stand further away from door (facing perpendicular)   Twisting trunk without moving the hips and knees Hold band at the level of ribcage, elbows bent,shoulder blades roll back and down like squeezing a pencil under armpit    Exhale twist,.10-15 deg away from door without moving your hips/ knees. Continue to  maintain equal weight through legs. Keep knee unlocked.  10 x 2

## 2017-12-30 NOTE — Therapy (Signed)
[place] MAIN [facility] SERVICES [address] [place], [place], [address] Phone: [phone]   Fax:  [phone]  Physical Therapy Treatment  Patient Details  Name: Jennifer Drake MRN: [id number] Date of Birth: 1986-04-04 Referring Provider: Jetta Lout   Encounter Date: 12/30/2017  PT End of Session - 12/30/17 1628    Visit Number  6    Number of Visits  8    Date for PT Re-Evaluation  01/19/18    Authorization Type  VA authorization 15 visits before 01/24/18     PT Start Time  1610    PT Stop Time  1700    PT Time Calculation (min)  50 min    Activity Tolerance  Patient tolerated treatment well;No increased pain    Behavior During Therapy  [facility] for tasks assessed/performed       No past medical history on file.  Past Surgical History:  Procedure Laterality Date  . CESAREAN SECTION    . LEEP      There were no vitals filed for this visit.  Subjective Assessment - 12/30/17 1611    Subjective  Pt reports she leaked with sneezing today    Pertinent History  Hx of pull-ups, sit-ups, crunches. Pt has stopped performing her exercise routine. Denied injuries to hips,spine, and legs. Pt has 4 children, ( 74, 13 and 49 years old twins) .  2 vaginal deliveries with stitches  and 1 c -sections for twins.      Patient Stated Goals  pain to subside and pelvic floor to get stronger and want to know more about diastasis recti          [facility] PT Assessment - 12/30/17 1806      Coordination   Gross Motor Movements are Fluid and Coordinated  --   poor dissassociation beteen trunk/ pelvis with multifidis ex               Pelvic Floor Special Questions - 12/30/17 1625    Diastasis Recti  1 finger above umbilicus     Pelvic Floor Internal Exam  pt consented verbally without contraindications    Exam Type  Vaginal    Palpation  slight tightness at iliococcgeus B. initially stronger anterior > posterior. Post Tx: achieved 4/5 circumferential and  sequential      Strength  good squeeze, good lift, able to hold agaisnt strong resistance    Strength # of reps  10    Strength # of seconds  1   with simulated cough       [facility] Adult PT Treatment/Exercise - 12/30/17 1806      Neuro Re-ed    Neuro Re-ed Details   see pt instructions      Manual Therapy   Internal Pelvic Floor  facilitation of posterior mm to correct delayed lengthening              PT Education - 12/30/17 1628    Education Details  HEP    Person(s) Educated  Patient    Methods  Explanation;Demonstration;Tactile cues;Verbal cues;Handout    Comprehension  Returned demonstration;Verbalized understanding          PT Long Term Goals - 12/16/17 1701      PT LONG TERM GOAL #1   Title  Pt will report less pain < 1/10 and will use proper lfitng. bending mechanics  when bending and lifting both toddler twins out of the bathroom in order to perform mother duties  Time  4    Period  Weeks    Status  Achieved      PT LONG TERM GOAL #2   Title  Pt will be able to cook for 30 mins without back pain in order to return ADLs     Time  8    Period  Weeks    Status  On-going      PT LONG TERM GOAL #3   Title  Pt will IND with modifications and techniques with fitness routines  to ensure proper dep core activation and proper alignment to minimize injuries     Time  8    Period  Weeks    Status  On-going      PT LONG TERM GOAL #4   Title  Pt will report improved stool consistency type will improve from type 1 ( 75%) to Type 4 ( 75%) in order to minimize straining of the pelvic floor    Time  8    Period  Weeks    Status  On-going      PT LONG TERM GOAL #5   Title  Pt will decrease her PFDI score from 51% to <31 % in order to have less leakage and less difficulty with emptying bladder     Time  8    Period  Weeks    Status  On-going      Additional Long Term Goals   Additional Long Term Goals  Yes      PT LONG TERM GOAL #6   Title  Pt will decrease  her [place] score from 13% to <8 % in order to minimize LBP and pelvic pain to return to ADLs.     Time  8    Period  Weeks    Status  On-going      PT LONG TERM GOAL #7   Title  Pt will demo less fingers width with diastasis recti from 4 fingers to < 2 fingers in order to increased intraabdominal pressure to optimize motility for better bowel movements, postural stability for bending. lifting,     Time  8    Period  Weeks    Status  Partially Met      PT LONG TERM GOAL #8   Title  Pt will decrease her ODI score from 23% to < 18% in order to minimize LBP and return to fitness     Time  8    Period  Weeks    Status  New      PT LONG TERM GOAL  #9   TITLE  Pt will decrease her PSQI score from 90% to < 50% in order to improve sleep quality for improved outcomes for learning at PT sessions and overall health    Time  12    Period  Weeks    Status  On-going            Plan - 12/30/17 1807    Clinical Impression Statement  Addressed pelvic floor mm today which demo'd slightly tight pelvic floor mm. Pt tolerated pelvic floor internal manual Tx and achieved proper coordination and progressed to quick pelvic floor contractions. Withholding endurance strengthening. Educated pt on co-activation pelvic floor with sneezing, coughing, laughing to minimize leakage. Pt reports her frequency issues has improved since last week. Pt's diastasis recti continues to improve. Added multifidis strengthening into HEP for core stability.  Pt continues to benefit from skilled PT.     Rehab Potential  Good    PT Frequency  2x / week    PT Duration  8 weeks    PT Treatment/Interventions  Stair training;Functional mobility training;Moist Heat;Gait training;Therapeutic activities;Therapeutic exercise;Patient/family education;Neuromuscular re-education;Manual techniques;Energy conservation;Scar mobilization    Consulted and Agree with Plan of Care  Patient       Patient will benefit from skilled therapeutic  intervention in order to improve the following deficits and impairments:  Improper body mechanics, Pain, Increased fascial restricitons, Increased muscle spasms, Postural dysfunction, Decreased scar mobility, Decreased mobility, Decreased coordination, Decreased activity tolerance, Decreased endurance, Decreased range of motion, Impaired flexibility, Decreased balance, Decreased strength  Visit Diagnosis: Muscle weakness (generalized)  Other symptoms and signs involving the musculoskeletal system     Problem List There are no active problems to display for this patient.   Jerl Mina ,PT, DPT, E-RYT  12/30/2017, 6:10 PM  [place] [facility] MAIN [facility] SERVICES [address] [place], [place], [address] Phone: [phone]   Fax:  [phone]  Name: Jennifer Drake MRN: [id number] Date of Birth: [birth year]

## 2018-01-04 ENCOUNTER — Encounter: Payer: Non-veteran care | Admitting: Physical Therapy

## 2018-01-06 ENCOUNTER — Ambulatory Visit: Payer: No Typology Code available for payment source | Admitting: Physical Therapy

## 2018-01-06 DIAGNOSIS — R29898 Other symptoms and signs involving the musculoskeletal system: Secondary | ICD-10-CM

## 2018-01-06 DIAGNOSIS — M6281 Muscle weakness (generalized): Secondary | ICD-10-CM | POA: Diagnosis not present

## 2018-01-06 NOTE — Therapy (Signed)
[place] MAIN [facility] SERVICES [address] [place], [place], [address] Phone: [phone]   Fax:  [phone]  Physical Therapy Treatment  Patient Details  Name: Jennifer Drake MRN: [id number] Date of Birth: 1986/01/08 Referring Provider: Jetta Lout   Encounter Date: 01/06/2018  PT End of Session - 01/06/18 1659    Visit Number  7    Number of Visits  8    Date for PT Re-Evaluation  01/19/18    Authorization Type  VA authorization 15 visits before 01/24/18     PT Start Time  1610    PT Stop Time  1705    PT Time Calculation (min)  55 min    Activity Tolerance  Patient tolerated treatment well;No increased pain    Behavior During Therapy  [facility] for tasks assessed/performed       No past medical history on file.  Past Surgical History:  Procedure Laterality Date  . CESAREAN SECTION    . LEEP      There were no vitals filed for this visit.  Subjective Assessment - 01/06/18 1610    Subjective  Pt reports she felt fine after last session. Pt woke up with 10/10 pain in her neck and mid back on Saturday after sleeping in a guest bed on an out of town trip. Pt took some Tylenol but it did not work.  Today, pt 's pain is 3-4/10 and she can move her neck. But with looking up and turning R and L and bending foward, she can feel it in her midback. Pt has stopped doing the bridging band exercises because of this pain. Pt reported she slept on one pillow and the mattress was soft.        Pertinent History  Hx of pull-ups, sit-ups, crunches. Pt has stopped performing her exercise routine. Denied injuries to hips,spine, and legs. Pt has 4 children, ( 74, 32 and 54 years old twins) .  2 vaginal deliveries with stitches  and 1 c -sections for twins.      Patient Stated Goals  pain to subside and pelvic floor to get stronger and want to know more about diastasis recti          [facility] PT Assessment - 01/06/18 1653      Observation/Other Assessments    Observations  (post Tx) slumped sitting with cervical extension with report of pain, cued for lengthening of spine prior to cervical ext , no pain       Palpation   Palpation comment  L paraspinal mm tensions,  R deviation of  T1-2                   [facility] Adult PT Treatment/Exercise - 01/06/18 1652      Moist Heat Therapy   Number Minutes Moist Heat  5 Minutes    Moist Heat Location  Cervical;Lumbar Spine      Manual Therapy   Manual therapy comments  STM along L paraspinals, inferior mob at 1st rib R, MWM with L lateral glide at T1-2                   PT Long Term Goals - 12/16/17 1701      PT LONG TERM GOAL #1   Title  Pt will report less pain < 1/10 and will use proper lfitng. bending mechanics  when bending and lifting both toddler twins out of the bathroom in order to perform mother duties  Time  4    Period  Weeks    Status  Achieved      PT LONG TERM GOAL #2   Title  Pt will be able to cook for 30 mins without back pain in order to return ADLs     Time  8    Period  Weeks    Status  On-going      PT LONG TERM GOAL #3   Title  Pt will IND with modifications and techniques with fitness routines  to ensure proper dep core activation and proper alignment to minimize injuries     Time  8    Period  Weeks    Status  On-going      PT LONG TERM GOAL #4   Title  Pt will report improved stool consistency type will improve from type 1 ( 75%) to Type 4 ( 75%) in order to minimize straining of the pelvic floor    Time  8    Period  Weeks    Status  On-going      PT LONG TERM GOAL #5   Title  Pt will decrease her PFDI score from 51% to <31 % in order to have less leakage and less difficulty with emptying bladder     Time  8    Period  Weeks    Status  On-going      Additional Long Term Goals   Additional Long Term Goals  Yes      PT LONG TERM GOAL #6   Title  Pt will decrease her Winton score from 13% to <8 % in order to minimize LBP and pelvic  pain to return to ADLs.     Time  8    Period  Weeks    Status  On-going      PT LONG TERM GOAL #7   Title  Pt will demo less fingers width with diastasis recti from 4 fingers to < 2 fingers in order to increased intraabdominal pressure to optimize motility for better bowel movements, postural stability for bending. lifting,     Time  8    Period  Weeks    Status  Partially Met      PT LONG TERM GOAL #8   Title  Pt will decrease her ODI score from 23% to < 18% in order to minimize LBP and return to fitness     Time  8    Period  Weeks    Status  New      PT LONG TERM GOAL  #9   TITLE  Pt will decrease her PSQI score from 90% to < 50% in order to improve sleep quality for improved outcomes for learning at PT sessions and overall health    Time  12    Period  Weeks    Status  On-going            Plan - 01/06/18 1654    Clinical Impression Statement  Pt presented with increased L paraspinal mm tensions and R deviation at T1-2 junction today which caused neck/shoulder/midback  pain since Saturday when she woke up after sleeping on a soft bed at a friend's house.  The pain was 10/10 and she had difficulty looking up and rotating spine when she woke up. Pt has stopped performing her PT exercises due to this pain.  It has since decreased to a 3-4/10 across the past days. After Tx today which pt tolerated without increased  pain, pt reported minimal pain with looking up and rotating trunk. Pt was cued and educated about upright postrure prior to turning trunk which helped to resolved the pain. Downgraded thoracolumbar exercises with theraband to minimize risk for mm flare-ups. Pt was able to demo exercises with yellow band without pain. Pt will continue to benefit from these thoracolumbar exercises. Pt reported a decreased pain from 3-4/10 to 1/10 pain with increased ROM with trunk rotation after today's Tx. Pt was educated about spinal alignment when sleeping and alternative to sleeping when  visiting as a guest if the guest bed is soft. Pt continues to benefit from skilled PT.      Rehab Potential  Good    PT Frequency  2x / week    PT Duration  8 weeks    PT Treatment/Interventions  Stair training;Functional mobility training;Moist Heat;Gait training;Therapeutic activities;Therapeutic exercise;Patient/family education;Neuromuscular re-education;Manual techniques;Energy conservation;Scar mobilization    Consulted and Agree with Plan of Care  Patient       Patient will benefit from skilled therapeutic intervention in order to improve the following deficits and impairments:  Improper body mechanics, Pain, Increased fascial restricitons, Increased muscle spasms, Postural dysfunction, Decreased scar mobility, Decreased mobility, Decreased coordination, Decreased activity tolerance, Decreased endurance, Decreased range of motion, Impaired flexibility, Decreased balance, Decreased strength  Visit Diagnosis: Muscle weakness (generalized)  Other symptoms and signs involving the musculoskeletal system     Problem List There are no active problems to display for this patient.   Jerl Mina ,PT, DPT, E-RYT  01/06/2018, 5:05 PM  [place] MAIN [facility] SERVICES [address] [place], [place], [address] Phone: [phone]   Fax:  [phone]  Name: Jennifer Drake MRN: [id number] Date of Birth: December 20, 1985

## 2018-01-13 ENCOUNTER — Ambulatory Visit: Payer: No Typology Code available for payment source | Attending: Family Medicine | Admitting: Physical Therapy

## 2018-01-13 DIAGNOSIS — R29898 Other symptoms and signs involving the musculoskeletal system: Secondary | ICD-10-CM | POA: Insufficient documentation

## 2018-01-13 DIAGNOSIS — M6281 Muscle weakness (generalized): Secondary | ICD-10-CM | POA: Diagnosis not present

## 2018-01-13 NOTE — Therapy (Addendum)
[place] [facility] MAIN [facility] SERVICES [address] [place], [place], [address] Phone: [phone]   Fax:  [phone]  Physical Therapy Treatment  Patient Details  Name: Jennifer Drake MRN: [id number] Date of Birth: Mar 20, 1986 Referring Provider: Tiney Rouge   Encounter Date: 01/13/2018  PT End of Session - 01/13/18 1648    Visit Number  8    Date for PT Re-Evaluation  01/19/18    Authorization Type  VA authorization 15 visits before 01/24/18     PT Start Time  1602    PT Stop Time  1647    PT Time Calculation (min)  45 min    Activity Tolerance  Patient tolerated treatment well;No increased pain    Behavior During Therapy  [facility] for tasks assessed/performed       No past medical history on file.  Past Surgical History:  Procedure Laterality Date  . CESAREAN SECTION    . LEEP      There were no vitals filed for this visit.  Subjective Assessment - 01/13/18 1606    Subjective  Pt felt 90-95% improvement after last session. Pt has been able to return to her PT exercises again    Pertinent History  Hx of pull-ups, sit-ups, crunches. Pt has stopped performing her exercise routine. Denied injuries to hips,spine, and legs. Pt has 4 children, ( 16, 70 and 37 years old twins) .  2 vaginal deliveries with stitches  and 1 c -sections for twins.      Patient Stated Goals  pain to subside and pelvic floor to get stronger and want to know more about diastasis recti          [facility] PT Assessment - 01/13/18 1649      Palpation   Spinal mobility  increased posterior intercostal tightness on B of lower ribs and B QL        Ambulation/Gait   Gait Comments  posterior COM, shoulder more posterior , heel striking                   [facility] Adult PT Treatment/Exercise - 01/13/18 1650      Therapeutic Activites    Therapeutic Activities  --   see pt instructions      Neuro Re-ed    Neuro Re-ed Details   see pt instructions       Manual Therapy   Manual therapy comments  increased posterior intercostal tightness on B of lower ribs and B QL               PT Education - 01/13/18 1648    Education Details  HEP    Person(s) Educated  Patient    Methods  Explanation;Demonstration;Tactile cues;Verbal cues;Handout    Comprehension  Returned demonstration;Verbalized understanding          PT Long Term Goals - 01/13/18 1651      PT LONG TERM GOAL #1   Title  Pt will report less pain < 1/10 and will use proper lfitng. bending mechanics  when bending and lifting both toddler twins out of the bathroom in order to perform mother duties     Time  4    Period  Weeks    Status  Achieved      PT LONG TERM GOAL #2   Title  Pt will be able to cook for 30 mins without back pain in order to return ADLs     Time  8  Period  Weeks    Status  On-going      PT LONG TERM GOAL #3   Title  Pt will IND with modifications and techniques with fitness routines  to ensure proper dep core activation and proper alignment to minimize injuries     Time  8    Period  Weeks    Status  On-going      PT LONG TERM GOAL #4   Title  Pt will report improved stool consistency type will improve from type 1 ( 75%) to Type 4 ( 75%) in order to minimize straining of the pelvic floor    Time  8    Period  Weeks    Status  On-going      PT LONG TERM GOAL #5   Title  Pt will decrease her PFDI score from 51% to <31 % in order to have less leakage and less difficulty with emptying bladder     Time  8    Period  Weeks    Status  On-going      PT LONG TERM GOAL #6   Title  Pt will decrease her PDI score from 13% to <8 % in order to minimize LBP and pelvic pain to return to ADLs.     Time  8    Period  Weeks    Status  On-going      PT LONG TERM GOAL #7   Title  Pt will demo less fingers width with diastasis recti from 4 fingers to < 2 fingers in order to increased intraabdominal pressure to optimize motility for better bowel movements, postural  stability for bending. lifting,     Time  8    Period  Weeks    Status  Achieved      PT LONG TERM GOAL #8   Title  Pt will decrease her ODI score from 23% to < 18% in order to minimize LBP and return to fitness     Time  8    Period  Weeks    Status  On-going      PT LONG TERM GOAL  #9   TITLE  Pt will decrease her PSQI score from 90% to < 50% in order to improve sleep quality for improved outcomes for learning at PT sessions and overall health    Time  12    Period  Weeks    Status  On-going            Plan - 01/13/18 1649    Clinical Impression Statement Pt has achieved 2/9 goals with improved diastasis recti, deep core strength and coordination, less straining of pelvic floor mm and spine. Pt is progressing well towards remaining goals.    Pt demo'd improved diastasis recti but showed increased tightness along posterior lateral intercostals and QL bilaterally. Manual Tx addressed this which helped to minimize excessive lumbar lordosis.   Progressed pt to upright thoracolumbar strengthening and gait training. Pt demo'd improve co-activation of deep core mm with improved gait mechanics. Plan to assess pelvic floor again and progress to jogging next session. Pt continues to benefit from skilled PT.     Rehab Potential  Good    PT Frequency  1x / week    PT Duration  12 weeks    PT Treatment/Interventions  Stair training;Functional mobility training;Moist Heat;Gait training;Therapeutic activities;Therapeutic exercise;Patient/family education;Neuromuscular re-education;Manual techniques;Energy conservation;Scar mobilization    Consulted and Agree with Plan of Care  Patient  Patient will benefit from skilled therapeutic intervention in order to improve the following deficits and impairments:  Improper body mechanics, Pain, Increased fascial restricitons, Increased muscle spasms, Postural dysfunction, Decreased scar mobility, Decreased mobility, Decreased coordination,  Decreased activity tolerance, Decreased endurance, Decreased range of motion, Impaired flexibility, Decreased balance, Decreased strength  Visit Diagnosis: Muscle weakness (generalized)  Other symptoms and signs involving the musculoskeletal system     Problem List There are no active problems to display for this patient.   Mariane Masters ,PT, DPT, E-RYT  01/13/2018, 4:55 PM  [place] [facility] MAIN [facility] SERVICES [address] [place], [place], [address] Phone: [phone]   Fax:  [phone]  Name: Jennifer Drake MRN: [id number] Date of Birth: 10/30/1985

## 2018-01-13 NOTE — Patient Instructions (Addendum)
Deep core level 2.5  Slide heel 2-3" towards heels while keep four corners of ribs and pelvis down and low back not arching on exhale   6 min  _________   Add stretch   "Zig Zag Stretch"   "Reclined twist"   Lay on your back, knees bend Scoot hips to the R , leave shoulders in place Drop knees to the L side resting onto pillows to keep leg at the same width of hips Pillow under L thigh to minimize too much strain    "Thread the Needle" Table top, slide R forearm through while keeping L forearm perpendicular to bed  Switch 5 reps   Or at kitchen counter: Mini squat L hand on counter, place R hand onto L thigh  Look under L armpit      ____    "Mountain hiker throwing"  - band   Lunge position, tes point forward, trunk leans to create diagonal line with back leg)  Ski track stance, band under back foot,  50% weight on front leg (knee above ankle)  50% weight on back leg   Band are against buttocks and band Elbows point to the sky, hands holding band   Inhale, exhale extend elbows like you are throwing something overhead,  10 reps on each leg  X 2 sets     "Walk away pulling" with two bands over door   ski track stance, toes point forward Hand bands by hips, squeezing armpits and shoulder blades down and back  Slow forward walking, landing on midfoot  X 3-4 steps  Slow backward walking, ballmound down, then slow lowering of heels  Center of mass is slightly leaned forward

## 2018-01-19 ENCOUNTER — Ambulatory Visit: Payer: No Typology Code available for payment source | Admitting: Physical Therapy

## 2018-01-19 NOTE — Addendum Note (Signed)
Addended by: Mariane Masters on: 01/19/2018 04:46 PM   Modules accepted: Orders

## 2019-07-18 ENCOUNTER — Ambulatory Visit: Payer: Non-veteran care | Attending: Internal Medicine

## 2019-07-18 DIAGNOSIS — Z20822 Contact with and (suspected) exposure to covid-19: Secondary | ICD-10-CM

## 2019-07-19 LAB — NOVEL CORONAVIRUS, NAA: SARS-CoV-2, NAA: NOT DETECTED

## 2019-12-03 ENCOUNTER — Other Ambulatory Visit: Payer: Self-pay

## 2019-12-03 ENCOUNTER — Emergency Department
Admission: EM | Admit: 2019-12-03 | Discharge: 2019-12-03 | Disposition: A | Discharge status: Home / Self Care | Payer: No Typology Code available for payment source | Attending: Emergency Medicine | Admitting: Emergency Medicine

## 2019-12-03 DIAGNOSIS — R05 Cough: Secondary | ICD-10-CM | POA: Insufficient documentation

## 2019-12-03 DIAGNOSIS — R509 Fever, unspecified: Secondary | ICD-10-CM | POA: Diagnosis not present

## 2019-12-03 DIAGNOSIS — O98519 Other viral diseases complicating pregnancy, unspecified trimester: Secondary | ICD-10-CM | POA: Diagnosis present

## 2019-12-03 DIAGNOSIS — Z3A00 Weeks of gestation of pregnancy not specified: Secondary | ICD-10-CM | POA: Insufficient documentation

## 2019-12-03 DIAGNOSIS — Z79899 Other long term (current) drug therapy: Secondary | ICD-10-CM | POA: Diagnosis not present

## 2019-12-03 DIAGNOSIS — U071 COVID-19: Secondary | ICD-10-CM

## 2019-12-03 DIAGNOSIS — Z3490 Encounter for supervision of normal pregnancy, unspecified, unspecified trimester: Secondary | ICD-10-CM | POA: Insufficient documentation

## 2019-12-03 LAB — URINALYSIS, COMPLETE (UACMP) WITH MICROSCOPIC
Bacteria, UA: NONE SEEN
Bilirubin Urine: NEGATIVE
Glucose, UA: NEGATIVE mg/dL
Hgb urine dipstick: NEGATIVE
Ketones, ur: 20 mg/dL — AB
Leukocytes,Ua: NEGATIVE
Nitrite: NEGATIVE
Protein, ur: NEGATIVE mg/dL
Specific Gravity, Urine: 1.017 (ref 1.005–1.030)
pH: 6 (ref 5.0–8.0)

## 2019-12-03 LAB — SARS CORONAVIRUS 2 BY RT PCR (HOSPITAL ORDER, PERFORMED IN ~~LOC~~ HOSPITAL LAB): SARS Coronavirus 2: POSITIVE — AB

## 2019-12-03 LAB — POC URINE PREG, ED: Preg Test, Ur: POSITIVE — AB

## 2019-12-03 LAB — HCG, QUANTITATIVE, PREGNANCY: hCG, Beta Chain, Quant, S: 112259 m[IU]/mL — ABNORMAL HIGH (ref <5)

## 2019-12-03 MED ORDER — ACETAMINOPHEN 500 MG PO TABS
1000.0000 mg | ORAL_TABLET | Freq: Once | ORAL | Status: AC
  Administered 2019-12-03: 1000 mg via ORAL
  Filled 2019-12-03: qty 2

## 2019-12-03 NOTE — ED Provider Notes (Signed)
[facility] Emergency Department Provider Note  ____________________________________________   First MD Initiated Contact with Patient 12/03/19 1750     (approximate)  I have reviewed the triage vital signs and the nursing notes.   HISTORY  Chief Complaint Fever    HPI Jennifer Drake is a 34 y.o. female presents emergency department with complaints of a fever, cough occasionally, no vomiting or diarrhea.  Patient had a positive pregnancy test about 8 weeks ago and has not had her prenatal care done yet.  She denies vaginal cramping or bleeding.   Denies tick bite   History reviewed. No pertinent past medical history.  There are no problems to display for this patient.   Past Surgical History:  Procedure Laterality Date  . CESAREAN SECTION    . LEEP      Prior to Admission medications   Medication Sig Start Date End Date Taking? Authorizing Provider  cholecalciferol (VITAMIN D) 1000 units tablet Take 1,000 Units by mouth daily.    [provider]  cyclobenzaprine (FLEXERIL) 10 MG tablet Take 10 mg by mouth 3 (three) times daily as needed for muscle spasms.    [provider]  ferrous sulfate 325 (65 FE) MG tablet Take 325 mg by mouth daily with breakfast.    [provider]  Melatonin 3 MG TABS Take by mouth.    [provider]  naproxen (NAPROSYN) 125 MG/5ML suspension Take by mouth 2 (two) times daily with a meal.    [provider]  naproxen (NAPROSYN) 500 MG tablet Take 500 mg by mouth 2 (two) times daily with a meal.    [provider]    Allergies Patient has no known allergies.  History reviewed. No pertinent family history.  Social History Social History   Tobacco Use  . Smoking status: Never Smoker  . Smokeless tobacco: Never Used  Substance Use Topics  . Alcohol use: Not Currently    Comment: ocassional   . Drug use: Never    Review of Systems  Constitutional: Positive  fever/chills Eyes: No visual changes. ENT: No sore throat. Respiratory: Positive cough Cardiovascular: Denies chest pain Gastrointestinal: Denies abdominal pain Genitourinary: Negative for dysuria.  Denies vaginal cramping or bleeding Musculoskeletal: Negative for back pain. Skin: Negative for rash. Psychiatric: no mood changes,     ____________________________________________   PHYSICAL EXAM:  VITAL SIGNS: ED Triage Vitals [12/03/19 1723]  Enc Vitals Group     BP (!) 138/103     Pulse Rate 104     Resp 16     Temp 99.9 F (37.7 C)     Temp Source Oral     SpO2 100 %     Weight 175 lb (79.4 kg)     Height 5\' 7"  (1.702 m)     Head Circumference      Peak Flow      Pain Score 0     Pain Loc      Pain Edu?      Excl. in GC?     Constitutional: Alert and oriented. Well appearing and in no acute distress. Eyes: Conjunctivae are normal.  Head: Atraumatic. Nose: No congestion/rhinnorhea. Mouth/Throat: Mucous membranes are moist.   Neck:  supple no lymphadenopathy noted Cardiovascular: Normal rate, regular rhythm. Heart sounds are normal Respiratory: Normal respiratory effort.  No retractions, lungs c t a  Abd: soft nontender bs normal all 4 quad GU: deferred Musculoskeletal: FROM all extremities, warm and well perfused Neurologic:  Normal speech and language.  Skin:  Skin is warm, dry and intact. No rash noted. Psychiatric: Mood and affect are normal. Speech and behavior are normal.  ____________________________________________   LABS (all labs ordered are listed, but only abnormal results are displayed)  Labs Reviewed  SARS CORONAVIRUS 2 BY RT PCR (HOSPITAL ORDER, PERFORMED IN [facility]) - Abnormal; Notable for the following components:      Result Value   SARS Coronavirus 2 POSITIVE (*)    All other components within normal limits  URINALYSIS, COMPLETE (UACMP) WITH MICROSCOPIC - Abnormal; Notable for the following components:   Color, Urine  YELLOW (*)    APPearance CLEAR (*)    Ketones, ur 20 (*)    All other components within normal limits  HCG, QUANTITATIVE, PREGNANCY - Abnormal; Notable for the following components:   hCG, Beta Chain, Quant, S 112,259 (*)    All other components within normal limits  POC URINE PREG, ED - Abnormal; Notable for the following components:   Preg Test, Ur Positive (*)    All other components within normal limits   ____________________________________________   ____________________________________________  RADIOLOGY    ____________________________________________   PROCEDURES  Procedure(s) performed: No  Procedures    ____________________________________________   INITIAL IMPRESSION / ASSESSMENT AND PLAN / ED COURSE  Pertinent labs & imaging results that were available during my care of the patient were reviewed by me and considered in my medical decision making (see chart for details).   Patient is a 34 year old approximately 8-week pregnant female presents emergency department with a fever.  See HPI  Physical exam shows patient to appear well.  Exam is unremarkable  DDx: Covid, UTI, common cold  Beta hCG, Covid, urinalysis  ----------------------------------------- 7:35 PM on 12/03/2019 -----------------------------------------  Beta-hCG is 112,259 Poc preg positive, urinalysis normal, Covid test is positive  Did explain the findings to the patient.  She does work from home so she will continue to quarantine for 14 days.  I did explain to her that her family members were also need to quarantine for the next 14 days.  If any of them develop fevers or cold symptoms they should also be tested for Covid.  Take over-the-counter Tylenol for fever as needed.  Return emergency department worsening.  Follow-up with [facility] for prenatal care.  As part of my medical decision making, I reviewed the following data within the electronic MEDICAL RECORD NUMBER Nursing  notes reviewed and incorporated, Labs reviewed , Old chart reviewed, Notes from prior ED visits and [place] Controlled Substance Database  ____________________________________________   FINAL CLINICAL IMPRESSION(S) / ED DIAGNOSES  Final diagnoses:  COVID-19      NEW MEDICATIONS STARTED DURING THIS VISIT:  New Prescriptions   No medications on file     Note:  This document was prepared using Dragon voice recognition software and may include unintentional dictation errors.    Faythe Ghee, PA-C 12/03/19 Mable Paris, MD 12/04/19 0000

## 2019-12-03 NOTE — Discharge Instructions (Signed)
Follow-up with your regular doctor as needed.  Return the emergency department if worsening.  Follow-up with [facility] for prenatal care.  You may want to call them and discuss having Covid in pregnancy.  Tylenol for fever as needed.  Drink plenty of fluids.  Covid is a virus and take over-the-counter measures to document any upper respiratory viral infection.

## 2019-12-03 NOTE — ED Notes (Signed)
Pt states fever that started this morning, Tmax 100.4 this morning, since then has had "fluctuating temps of 99". Pt also states +home preg test, estimates approx [redacted] weeks along. Pt states not confirmed with OB. Pt denies cramping/bleeding. Pt denies loss of taste/smell at this time.

## 2019-12-03 NOTE — ED Notes (Signed)
Greig Right PA-C aware that Covid test is positive.

## 2019-12-03 NOTE — ED Triage Notes (Signed)
Pt states she has had a fluctuating fever. Took 1,000mg  tylenol at 4am and 10:50am today. C/o HA as well. States some cough.   Pt is pregnant, unsure how far along. + test at home on July 11 and 12th. States had a miscarriage in March. Denies cramping or bleeding.

## 2019-12-04 ENCOUNTER — Telehealth: Payer: Self-pay | Admitting: Physician Assistant

## 2019-12-04 NOTE — Telephone Encounter (Signed)
Called to discuss with Jennifer Drake about Covid symptoms and the use of bamlanivimab/etesevimab or casirivimab/imdevimab, a monoclonal antibody infusion for those with mild to moderate Covid symptoms and at a high risk of hospitalization.     Pt is qualified for this infusion at the monoclonal antibody infusion center due to co-morbid conditions and/or a member of an at-risk group (pregnant), however would like to think more about the infusion at this time. Symptoms tier reviewed as well as criteria for ending isolation.  Symptoms reviewed that would warrant ED/Hospital evaluation. Preventative practices reviewed. Patient verbalized understanding. Patient advised to call back if he decides that he does want to get infusion. Callback number to the infusion center given. Patient advised to go to Urgent care or ED with severe symptoms. Last date pt would be eligible for infusion is 8/2.     Cline Crock PA-C

## 2019-12-28 DIAGNOSIS — Z3490 Encounter for supervision of normal pregnancy, unspecified, unspecified trimester: Secondary | ICD-10-CM | POA: Insufficient documentation

## 2020-01-03 LAB — OB RESULTS CONSOLE VARICELLA ZOSTER ANTIBODY, IGG: Varicella: IMMUNE

## 2020-01-03 LAB — OB RESULTS CONSOLE RUBELLA ANTIBODY, IGM: Rubella: IMMUNE

## 2020-01-03 LAB — OB RESULTS CONSOLE HEPATITIS B SURFACE ANTIGEN: Hepatitis B Surface Ag: NEGATIVE

## 2020-05-14 ENCOUNTER — Other Ambulatory Visit: Payer: Self-pay

## 2020-05-14 ENCOUNTER — Inpatient Hospital Stay: Payer: No Typology Code available for payment source

## 2020-05-14 ENCOUNTER — Encounter: Payer: Self-pay | Admitting: Hematology and Oncology

## 2020-05-14 ENCOUNTER — Inpatient Hospital Stay
Payer: No Typology Code available for payment source | Attending: Hematology and Oncology | Admitting: Hematology and Oncology

## 2020-05-14 VITALS — BP 104/62 | HR 99 | Temp 97.7°F | Resp 16 | Wt 192.2 lb

## 2020-05-14 DIAGNOSIS — Z79899 Other long term (current) drug therapy: Secondary | ICD-10-CM | POA: Diagnosis not present

## 2020-05-14 DIAGNOSIS — O99353 Diseases of the nervous system complicating pregnancy, third trimester: Secondary | ICD-10-CM | POA: Diagnosis not present

## 2020-05-14 DIAGNOSIS — O99613 Diseases of the digestive system complicating pregnancy, third trimester: Secondary | ICD-10-CM | POA: Diagnosis not present

## 2020-05-14 DIAGNOSIS — O99013 Anemia complicating pregnancy, third trimester: Secondary | ICD-10-CM | POA: Diagnosis not present

## 2020-05-14 DIAGNOSIS — D509 Iron deficiency anemia, unspecified: Secondary | ICD-10-CM | POA: Diagnosis present

## 2020-05-14 DIAGNOSIS — G2581 Restless legs syndrome: Secondary | ICD-10-CM | POA: Insufficient documentation

## 2020-05-14 DIAGNOSIS — Z791 Long term (current) use of non-steroidal anti-inflammatories (NSAID): Secondary | ICD-10-CM | POA: Diagnosis not present

## 2020-05-14 DIAGNOSIS — D649 Anemia, unspecified: Secondary | ICD-10-CM

## 2020-05-14 DIAGNOSIS — K5900 Constipation, unspecified: Secondary | ICD-10-CM | POA: Insufficient documentation

## 2020-05-14 LAB — CBC WITH DIFFERENTIAL/PLATELET
Abs Immature Granulocytes: 0.14 10*3/uL — ABNORMAL HIGH (ref 0.00–0.07)
Basophils Absolute: 0 10*3/uL (ref 0.0–0.1)
Basophils Relative: 0 %
Eosinophils Absolute: 0.1 10*3/uL (ref 0.0–0.5)
Eosinophils Relative: 2 %
HCT: 31.2 % — ABNORMAL LOW (ref 36.0–46.0)
Hemoglobin: 10.3 g/dL — ABNORMAL LOW (ref 12.0–15.0)
Immature Granulocytes: 2 %
Lymphocytes Relative: 17 %
Lymphs Abs: 1.1 10*3/uL (ref 0.7–4.0)
MCH: 23.1 pg — ABNORMAL LOW (ref 26.0–34.0)
MCHC: 33 g/dL (ref 30.0–36.0)
MCV: 70 fL — ABNORMAL LOW (ref 80.0–100.0)
Monocytes Absolute: 0.8 10*3/uL (ref 0.1–1.0)
Monocytes Relative: 13 %
Neutro Abs: 4.3 10*3/uL (ref 1.7–7.7)
Neutrophils Relative %: 66 %
Platelets: 337 10*3/uL (ref 150–400)
RBC: 4.46 MIL/uL (ref 3.87–5.11)
RDW: 15.9 % — ABNORMAL HIGH (ref 11.5–15.5)
WBC: 6.4 10*3/uL (ref 4.0–10.5)
nRBC: 0 % (ref 0.0–0.2)

## 2020-05-14 LAB — COMPREHENSIVE METABOLIC PANEL
ALT: 30 U/L (ref 0–44)
AST: 19 U/L (ref 15–41)
Albumin: 3.2 g/dL — ABNORMAL LOW (ref 3.5–5.0)
Alkaline Phosphatase: 120 U/L (ref 38–126)
Anion gap: 10 (ref 5–15)
BUN: 8 mg/dL (ref 6–20)
CO2: 24 mmol/L (ref 22–32)
Calcium: 8.9 mg/dL (ref 8.9–10.3)
Chloride: 102 mmol/L (ref 98–111)
Creatinine, Ser: 0.56 mg/dL (ref 0.44–1.00)
GFR, Estimated: >60 mL/min (ref >60)
Glucose, Bld: 79 mg/dL (ref 70–99)
Potassium: 4 mmol/L (ref 3.5–5.1)
Sodium: 136 mmol/L (ref 135–145)
Total Bilirubin: 0.4 mg/dL (ref 0.3–1.2)
Total Protein: 7 g/dL (ref 6.5–8.1)

## 2020-05-14 LAB — IRON AND TIBC
Iron: 57 ug/dL (ref 28–170)
Saturation Ratios: 13 % (ref 10.4–31.8)
TIBC: 449 ug/dL (ref 250–450)
UIBC: 392 ug/dL

## 2020-05-14 LAB — RETICULOCYTES
Immature Retic Fract: 29.1 % — ABNORMAL HIGH (ref 2.3–15.9)
RBC.: 4.46 MIL/uL (ref 3.87–5.11)
Retic Count, Absolute: 97.2 10*3/uL (ref 19.0–186.0)
Retic Ct Pct: 2.2 % (ref 0.4–3.1)

## 2020-05-14 LAB — FERRITIN: Ferritin: 23 ng/mL (ref 11–307)

## 2020-05-14 LAB — VITAMIN B12: Vitamin B-12: 243 pg/mL (ref 180–914)

## 2020-05-14 LAB — FOLATE: Folate: 21 ng/mL (ref >5.9)

## 2020-05-14 LAB — PATHOLOGIST SMEAR REVIEW

## 2020-05-14 NOTE — Progress Notes (Signed)
[facility]  [address], [address] [place], [place] [address] Phone: [phone]  Fax: [phone]   Clinic day:  05/14/2020  Chief Complaint: Jennifer Drake is a 35 y.o. female with anemia complicating pregnancy in the third trimester who is referred in consultation by Jennifer Drake, CNM for assessment and management.  HPI:  The patient was seen for routine prenatal care on 05/01/2020 by Jennifer Drake, CNM.  She was G4P3 at 29.5 weeks.  Hemoglobin was 9.9.  Ferritin was 40.  She had been taking iron supplement once a day for 2 weeks.  CBC has been followed: 07/12/2014:  Hematocrit 35.6.  Hemoglobin 11.7.  MCV 68.0.  Platelets 326,000.  WBC 5400 with an ANC 2800. 07/23/2019:  Hematocrit 35.0.  Hemoglobin 11.5.  MCV 69.0.  Platelets 317,000.  WBC 4900 with an ANC 2800. 04/19/2020:  Hematocrit 30.9.  Hemoglobin   9.7.  MCV 72.5.  Platelets 362,000.  WBC 8600 with an ANC 6040.  Hemoglobin electrophoresis on 05/01/2020 was normal with a hemoglobin F of 0%, hemoglobin A 97.7%, and hemoglobin A2 2.3%.  Ferritin was 40.  Urinalysis on 04/19/2020 revealed no hematuria.  TSH on 01/03/2020 was 2.07 (normal).  Symptomatically, she has been fine. She is pregnant with a boy Jennifer Drake). She will be 32 weeks on Thursday, 05/17/2020. She has been taking a prenatal vitamin for 6-7 months.  The patient reports occasional lightheadedness, coughing after she eats, shortness of breath on exertion, constipation, pelvic pain, knee pain, restless legs, and balance problems. The pelvic pain began a few weeks ago but has improved. She sometimes sees floaters during this pregnancy.  She denies fevers, sweats, headaches, runny nose, sore throat, chest pain, palpitations, nausea, vomiting, diarrhea, urinary symptoms, skin changes, numbness, and weakness.  She tries to eat healthy but does eat fast food occasionally. She eats meat everyday and craves bacon. She denies ice pica. She starting  taking oral iron once daily about 1 month ago.  She had a nosebleed this morning but it was "just a trickle" and lasted 20 minutes. She has occasional gum bleeds. She denies blood in the stool and hematuria.  Before this pregnancy, her periods were irregular and lasted for 7 days. They were heavy for 4 of 7 days. She saw an OBGYN because she was concerned about fibroids but they did not find anything wrong.  She has been pregnant 3 other times. She gave birth in 2010 (boy), 2013 (boy), and 55 (twins - boy and girl)). Her two sons were born at 21 weeks; she had a C section at 37 weeks for the twins. She was anemic during at least two of the pregnancies but was just told to take more iron. She stopped taking oral iron after delivery because she felt like she did not need it anymore. She has never needed a transfusion.  The patient had her wisdom teeth taken out when she was a teenager and does not remember bleeding excessively.  She denies a family history of blood disorders.   Past Medical History:  Diagnosis Date  . Anemia     Past Surgical History:  Procedure Laterality Date  . CESAREAN SECTION    . LEEP      Family History  Problem Relation Age of Onset  . Glaucoma Mother   . Hypertension Father   . Gout Father   . Cancer Father     Social History:  reports that she has never smoked. She has never used  smokeless tobacco. She reports previous alcohol use. She reports that she does not use drugs. She denies alcohol and tobacco use. She denies any exposure to radiation or toxins. She moved here from [place] in 2016. She does not work. The patient is accompanied by her husband, Jennifer Drake, today.  Allergies: No Known Allergies  Current Medications: Current Outpatient Medications  Medication Sig Dispense Refill  . docusate sodium (COLACE) 100 MG capsule Take 100 mg by mouth 2 (two) times daily.    . ferrous sulfate 325 (65 FE) MG tablet Take 325 mg by mouth daily with breakfast.     . Prenatal Vit-Fe Fumarate-FA (PRENATAL VITAMINS PO) Take by mouth.    . cholecalciferol (VITAMIN D) 1000 units tablet Take 1,000 Units by mouth daily. (Patient not taking: Reported on 05/14/2020)    . cyclobenzaprine (FLEXERIL) 10 MG tablet Take 10 mg by mouth 3 (three) times daily as needed for muscle spasms. (Patient not taking: Reported on 05/14/2020)    . Melatonin 3 MG TABS Take by mouth. (Patient not taking: Reported on 05/14/2020)    . naproxen (NAPROSYN) 125 MG/5ML suspension Take by mouth 2 (two) times daily with a meal. (Patient not taking: Reported on 05/14/2020)    . naproxen (NAPROSYN) 500 MG tablet Take 500 mg by mouth 2 (two) times daily with a meal. (Patient not taking: Reported on 05/14/2020)     No current facility-administered medications for this visit.    Review of Systems  Constitutional: Negative for chills, diaphoresis, fever, malaise/fatigue and weight loss.  HENT: Positive for nosebleeds (this morning). Negative for congestion, ear discharge, ear pain, hearing loss, sinus pain, sore throat and tinnitus.        Occasional gum bleeds  Eyes: Negative for blurred vision.       Sees floaters  Respiratory: Positive for cough (after she eats) and shortness of breath (on exertion). Negative for hemoptysis and sputum production.   Cardiovascular: Negative for chest pain, palpitations and leg swelling.  Gastrointestinal: Positive for constipation. Negative for abdominal pain, blood in stool, diarrhea, heartburn, melena, nausea and vomiting.       Eating well. No ice pica.  Genitourinary: Negative for dysuria, frequency, hematuria and urgency.       Pregnant. Pelvic pain.  Musculoskeletal: Positive for joint pain (knees). Negative for back pain, myalgias and neck pain.  Skin: Negative for itching and rash.  Neurological: Positive for dizziness (occasional lightheadedness). Negative for tingling, sensory change, weakness and headaches.       Restless legs. Balance problems.   Endo/Heme/Allergies: Does not bruise/bleed easily.  Psychiatric/Behavioral: Negative for depression and memory loss. The patient is not nervous/anxious and does not have insomnia.   All other systems reviewed and are negative.   Performance Status (ECOG): 1  Vital Signs: Blood pressure 104/62, pulse 99, temperature 97.7 F (36.5 C), temperature source Tympanic, resp. rate 16, weight 192 lb 3.9 oz (87.2 kg), last menstrual period 10/01/2019, SpO2 100 %.  Physical Exam Vitals and nursing note reviewed.  Constitutional:      General: She is not in acute distress.    Appearance: She is not diaphoretic.  HENT:     Head: Normocephalic and atraumatic.     Comments: Dark hair.    Mouth/Throat:     Mouth: Mucous membranes are moist.     Pharynx: Oropharynx is clear.  Eyes:     General: No scleral icterus.    Extraocular Movements: Extraocular movements intact.     Conjunctiva/sclera: Conjunctivae normal.  Pupils: Pupils are equal, round, and reactive to light.     Comments: Glasses.  Brown eyes.  Cardiovascular:     Rate and Rhythm: Normal rate and regular rhythm.     Heart sounds: Normal heart sounds. No murmur heard.   Pulmonary:     Effort: Pulmonary effort is normal. No respiratory distress.     Breath sounds: Normal breath sounds. No wheezing or rales.  Chest:     Chest wall: No tenderness.  Breasts:     Right: No axillary adenopathy or supraclavicular adenopathy.     Left: No axillary adenopathy or supraclavicular adenopathy.    Abdominal:     General: Bowel sounds are normal. There is no distension.     Palpations: Abdomen is soft. There is no mass.     Tenderness: There is no abdominal tenderness. There is no guarding or rebound.     Comments: Pregnant  Musculoskeletal:        General: No swelling or tenderness. Normal range of motion.     Cervical back: Normal range of motion and neck supple.  Lymphadenopathy:     Head:     Right side of head: No  preauricular, posterior auricular or occipital adenopathy.     Left side of head: No preauricular, posterior auricular or occipital adenopathy.     Upper Body:     Right upper body: No supraclavicular or axillary adenopathy.     Left upper body: No supraclavicular or axillary adenopathy.     Lower Body: No right inguinal adenopathy. No left inguinal adenopathy.  Skin:    General: Skin is warm and dry.  Neurological:     Mental Status: She is alert and oriented to person, place, and time.  Psychiatric:        Behavior: Behavior normal.        Thought Content: Thought content normal.        Judgment: Judgment normal.    No visits with results within 3 Day(s) from this visit.  Latest known visit with results is:  Admission on 12/03/2019, Discharged on 12/03/2019  Component Date Value Ref Range Status  . Preg Test, Ur 12/03/2019 Positive* Negative Final  . SARS Coronavirus 2 12/03/2019 POSITIVE* NEGATIVE Final   Comment: RESULT CALLED TO, READ BACK BY AND VERIFIED WITH: SONJIA WEAVER @1922  12/03/19 MJU (NOTE) SARS-CoV-2 target nucleic acids are DETECTED  SARS-CoV-2 RNA is generally detectable in upper respiratory specimens  during the acute phase of infection.  Positive results are indicative  of the presence of the identified virus, but do not rule out bacterial infection or co-infection with other pathogens not detected by the test.  Clinical correlation with patient history and  other diagnostic information is necessary to determine patient infection status.  The expected result is negative.  Fact Sheet for Patients:   12/05/19   Fact Sheet for Healthcare Providers:   BoilerBrush.com.cy    This test is not yet approved or cleared by the https://pope.com/ FDA and  has been authorized for detection and/or diagnosis of SARS-CoV-2 by FDA under an Emergency Use Authorization (EUA).  This EUA will remain in effect (meaning this  test                           can be used) for the duration of  the COVID-19 declaration under Section 564(b)(1) of the Act, 21 U.S.C. section 360-bbb-3(b)(1), unless the authorization is terminated or revoked sooner.  Performed at [facility], [address], [place], [place] [address]   . Color, Urine 12/03/2019 YELLOW* YELLOW Final  . APPearance 12/03/2019 CLEAR* CLEAR Final  . Specific Gravity, Urine 12/03/2019 1.017  1.005 - 1.030 Final  . pH 12/03/2019 6.0  5.0 - 8.0 Final  . Glucose, UA 12/03/2019 NEGATIVE  NEGATIVE mg/dL Final  . Hgb urine dipstick 12/03/2019 NEGATIVE  NEGATIVE Final  . Bilirubin Urine 12/03/2019 NEGATIVE  NEGATIVE Final  . Ketones, ur 12/03/2019 20* NEGATIVE mg/dL Final  . Protein, ur 01/75/1025 NEGATIVE  NEGATIVE mg/dL Final  . Nitrite 85/27/7824 NEGATIVE  NEGATIVE Final  . Glori Luis 12/03/2019 NEGATIVE  NEGATIVE Final  . RBC / HPF 12/03/2019 0-5  0 - 5 RBC/hpf Final  . WBC, UA 12/03/2019 0-5  0 - 5 WBC/hpf Final  . Bacteria, UA 12/03/2019 NONE SEEN  NONE SEEN Final  . Squamous Epithelial / LPF 12/03/2019 0-5  0 - 5 Final  . Mucus 12/03/2019 PRESENT   Final   Performed at [facility], [address] [place], [place] [address]  . hCG, Beta Chain, Quant, S 12/03/2019 112,259* <5 mIU/mL Final   Comment:          GEST. AGE      CONC.  (mIU/mL)   <=1 WEEK        5 - 50     2 WEEKS       50 - 500     3 WEEKS       100 - 10,000     4 WEEKS     1,000 - 30,000     5 WEEKS     3,500 - 115,000   6-8 WEEKS     12,000 - 270,000    12 WEEKS     15,000 - 220,000        FEMALE AND NON-PREGNANT FEMALE:     LESS THAN 5 mIU/mL Performed at [facility], [address], [place], [place] [address]     Assessment:  Michaeleen Down is a 35 y.o. female with microcytic anemia who is currently [redacted] weeks pregnant.  Limited labs are available.  MCV has ranged between 69-72.5.  Diet appears good.  She denies any bleeding.  Labs on 04/19/2020  revealed a hematocrit 30.9, hemoglobin 9.7, MCV 72.5, platelets 362,000, WBC 8600 with an ANC 6040.  Hemoglobin electrophoresis on 05/01/2020 was normal with a hemoglobin F of 0%, hemoglobin A 97.7%, and hemoglobin A2 2.3%.  Ferritin was 40.  Urinalysis on 04/19/2020 revealed no hematuria.  TSH on 01/03/2020 was 2.07 (normal).  Plan: 1.   Labs today:  CBC with diff, CMP, retic, ferritin, iron studies,sed rate, B12, folate, LDH, haptoglobin, DAT. 2.   Peripheral smear for path review.  3.   Microcytic anemia  She has a history of microcytic anemia.   Hemoglobin 11.7 with a MCV 68.0 on 07/12/2014.   Hemoglobin   9.7 with a MCV 72.5 on 04/19/2020.  Hemoglobin electrophoresis was normal but does not exclude alpha thalassemia trait.   Consider alpha thalassemia evaluation.  Continue oral iron.  Discuss additional work-up. 4.   RTC in 1 week for MD assessment, review of work-up, and discussion regarding direction of therapy.  I discussed the assessment and treatment plan with the patient.  The patient was provided an opportunity to ask questions and all were answered.  The patient agreed with the plan and demonstrated an understanding of the instructions.  The patient was advised to  call back or seek an in person evaluation if the symptoms worsen or if the condition fails to improve as anticipated.  I provided 26 minutes of face-to-face time during this this encounter and > 50% was spent counseling as documented under my assessment and plan.  An additional 10 minutes were spent reviewing her chart (Epic and Care Everywhere) including notes, labs, and imaging studies.    Rosey Bath, MD, PhD 05/14/2020, 5:00 PM  I, Pietro Cassis, am acting as Neurosurgeon for [employer]. Merlene Pulling, MD, PhD.  I, Arnie Maiolo C. Merlene Pulling, MD, have reviewed the above documentation for accuracy and completeness, and I agree with the above.

## 2020-05-14 NOTE — Progress Notes (Signed)
Patient here for initial oncology appointment, expresses concerns of fatigue and lightheadedness at times

## 2020-05-15 ENCOUNTER — Telehealth: Payer: Self-pay

## 2020-05-17 NOTE — Progress Notes (Signed)
[facility]  [address] [place], [place] [address] Phone: [phone]  Fax: [phone]   Clinic day:  05/21/2020  Chief Complaint: Jennifer Drake is a 35 y.o. female with anemia complicating pregnancy in the third trimester who is seen for review of work-up and discussion regarding direction of therapy.  HPI:  The patient was last seen in the hematology clinic on 05/14/2020 for new patient assessment. At that time, she felt "fine".   MCV had ranged between 69-72.5.  Diet appeared good.  She denied any bleeding.  Work-up revealed a hematocrit of 31.2, hemoglobin 10.3, MCV 70.0, platelets 337,000, WBC 6,400 with an ANC of 4300. Ferritin was 23 (low) with an iron saturation of 13% and a TIBC of 449. Reticulocyte count was 2.2%. Vitamin B12 was 243 (low normal) with a folate 21.0.   Peripheral smear revealed a normal leukocyte count and differential. There was microcytic, hypochromic anemia, with mild anisocytosis. Platelet count and morphology were normal.  Alpha-thalassemia genotypR is pending.  She was contacted on 05/15/2020 to begin oral iron and oral B12.  She started taking vitamin B12 500 mcg and oral iron after her last visit. She takes a prenatal at night.  During the interim, she has been "fine". She has been constipated during her whole pregnancy. Her nose bleeds have resolved. She still coughs before or after eating.  Her pelvic pain is stable. She has not had any shortness of breath or lightheadedness because she has not been out and about. She is eating well. She denies nausea and vomiting.  The patient is [redacted]w[redacted]d pregnant. She is planning a vaginal delivery. She has had a C section in the past.   Past Medical History:  Diagnosis Date  . Anemia     Past Surgical History:  Procedure Laterality Date  . CESAREAN SECTION    . LEEP      Family History  Problem Relation Age of Onset  . Glaucoma Mother   . Hypertension Father   . Gout  Father   . Cancer Father     Social History:  reports that she has never smoked. She has never used smokeless tobacco. She reports previous alcohol use. She reports that she does not use drugs. She denies alcohol and tobacco use. She denies any exposure to radiation or toxins. She moved here from [place] in 2016. She does not work. The patient is alone today.  Allergies: No Known Allergies  Current Medications: Current Outpatient Medications  Medication Sig Dispense Refill  . cholecalciferol (VITAMIN D) 1000 units tablet Take 1,000 Units by mouth daily.    . Cyanocobalamin (VITAMIN B 12 PO) Take by mouth.    . cyclobenzaprine (FLEXERIL) 10 MG tablet Take 10 mg by mouth 3 (three) times daily as needed for muscle spasms.    Marland Kitchen docusate sodium (COLACE) 100 MG capsule Take 100 mg by mouth 2 (two) times daily.    . ferrous sulfate 325 (65 FE) MG tablet Take 325 mg by mouth daily with breakfast.    . Melatonin 3 MG TABS Take by mouth.    . naproxen (NAPROSYN) 125 MG/5ML suspension Take by mouth 2 (two) times daily with a meal.    . naproxen (NAPROSYN) 500 MG tablet Take 500 mg by mouth 2 (two) times daily with a meal.    . Prenatal Vit-Fe Fumarate-FA (PRENATAL VITAMINS PO) Take by mouth.     No current facility-administered medications for this visit.    Review  of Systems  Constitutional: Negative for chills, diaphoresis, fever, malaise/fatigue and weight loss (up 1 lb).  HENT: Negative for congestion, ear discharge, ear pain, hearing loss, nosebleeds, sinus pain, sore throat and tinnitus.        Occasional gum bleeds.  Eyes: Negative for blurred vision.       Floaters.  Respiratory: Positive for cough (after she eats). Negative for hemoptysis, sputum production and shortness of breath (on exertion).   Cardiovascular: Negative for chest pain, palpitations and leg swelling.  Gastrointestinal: Positive for constipation. Negative for abdominal pain, blood in stool, diarrhea, heartburn, melena,  nausea and vomiting.       Eating well. No ice pica.  Genitourinary: Negative for dysuria, frequency, hematuria and urgency.       Pregnant. Pelvic pain.  Musculoskeletal: Negative for back pain, joint pain, myalgias and neck pain.  Skin: Negative for itching and rash.  Neurological: Negative for dizziness (occasional lightheadedness), tingling, sensory change, weakness and headaches.       Restless legs. Balance problems.  Endo/Heme/Allergies: Does not bruise/bleed easily.  Psychiatric/Behavioral: Negative for depression and memory loss. The patient is not nervous/anxious and does not have insomnia.   All other systems reviewed and are negative.  Performance Status (ECOG): 1  Vital Signs: Blood pressure (!) 120/58, pulse 100, temperature (!) 97.5 F (36.4 C), temperature source Tympanic, resp. rate 16, weight 193 lb 7.3 oz (87.8 kg), last menstrual period 10/01/2019, SpO2 100 %.  Physical Exam Vitals and nursing note reviewed.  Constitutional:      General: She is not in acute distress.    Appearance: She is not diaphoretic.  Eyes:     General: No scleral icterus.    Conjunctiva/sclera: Conjunctivae normal.  Abdominal:     Comments: Pregnant  Neurological:     Mental Status: She is alert and oriented to person, place, and time.  Psychiatric:        Behavior: Behavior normal.        Thought Content: Thought content normal.        Judgment: Judgment normal.    No visits with results within 3 Day(s) from this visit.  Latest known visit with results is:  Orders Only on 05/14/2020  Component Date Value Ref Range Status  . Path Review 05/14/2020 Blood smear is reviewed.   Final   Comment: Normal leukocyte count and differential. Microcytic, hypochromic anemia, with mild anisocytosis. Normal platelet count and morphology. Recommend correlation with serum iron studies. Reviewed by Kathi Simpers, M.D. Performed at [facility], [address] [place], [place]  [address]   . Folate 05/14/2020 21.0  >5.9 ng/mL Final   Performed at [facility], [address] [place], [place] [address]  . Vitamin B-12 05/14/2020 243  180 - 914 pg/mL Final   Comment: (NOTE) This assay is not validated for testing neonatal or myeloproliferative syndrome specimens for Vitamin B12 levels. Performed at [facility], [place] [address], [place], [place] [address]   . Iron 05/14/2020 57  28 - 170 ug/dL Final  . TIBC 05/14/2020 449  250 - 450 ug/dL Final  . Saturation Ratios 05/14/2020 13  10.4 - 31.8 % Final  . UIBC 05/14/2020 392  ug/dL Final   Performed at [facility], [address] [place], [place] [address]  . Ferritin 05/14/2020 23  11 - 307 ng/mL Final   Performed at [facility], [address] [place],  [address]  . Retic Ct Pct  05/14/2020 2.2  0.4 - 3.1 % Final  . RBC. 05/14/2020 4.46  3.87 - 5.11 MIL/uL Final  . Retic Count, Absolute 05/14/2020 97.2  19.0 - 186.0 K/uL Final  . Immature Retic Fract 05/14/2020 29.1* 2.3 - 15.9 % Final   Performed at [facility], [address], [place], [place] [address]  . Sodium 05/14/2020 136  135 - 145 mmol/L Final  . Potassium 05/14/2020 4.0  3.5 - 5.1 mmol/L Final  . Chloride 05/14/2020 102  98 - 111 mmol/L Final  . CO2 05/14/2020 24  22 - 32 mmol/L Final  . Glucose, Bld 05/14/2020 79  70 - 99 mg/dL Final   Glucose reference range applies only to samples taken after fasting for at least 8 hours.  . BUN 05/14/2020 8  6 - 20 mg/dL Final  . Creatinine, Ser 05/14/2020 0.56  0.44 - 1.00 mg/dL Final  . Calcium 02/54/2706 8.9  8.9 - 10.3 mg/dL Final  . Total Protein 05/14/2020 7.0  6.5 - 8.1 g/dL Final  . Albumin 23/76/2831 3.2* 3.5 - 5.0 g/dL Final  . AST 51/76/1607 19  15 - 41 U/L Final  . ALT 05/14/2020 30  0 - 44 U/L Final  . Alkaline Phosphatase 05/14/2020 120  38 - 126 U/L Final  . Total Bilirubin 05/14/2020 0.4  0.3 - 1.2 mg/dL Final  . GFR, Estimated 05/14/2020  >60  >60 mL/min Final   Comment: (NOTE) Calculated using the CKD-EPI Creatinine Equation (2021)   . Anion gap 05/14/2020 10  5 - 15 Final   Performed at [facility], [address], [place], [place] [address]  . WBC 05/14/2020 6.4  4.0 - 10.5 K/uL Final  . RBC 05/14/2020 4.46  3.87 - 5.11 MIL/uL Final  . Hemoglobin 05/14/2020 10.3* 12.0 - 15.0 g/dL Final  . HCT 26/94/8546 31.2* 36.0 - 46.0 % Final  . MCV 05/14/2020 70.0* 80.0 - 100.0 fL Final  . MCH 05/14/2020 23.1* 26.0 - 34.0 pg Final  . MCHC 05/14/2020 33.0  30.0 - 36.0 g/dL Final  . RDW 27/07/5007 15.9* 11.5 - 15.5 % Final  . Platelets 05/14/2020 337  150 - 400 K/uL Final  . nRBC 05/14/2020 0.0  0.0 - 0.2 % Final  . Neutrophils Relative % 05/14/2020 66  % Final  . Neutro Abs 05/14/2020 4.3  1.7 - 7.7 K/uL Final  . Lymphocytes Relative 05/14/2020 17  % Final  . Lymphs Abs 05/14/2020 1.1  0.7 - 4.0 K/uL Final  . Monocytes Relative 05/14/2020 13  % Final  . Monocytes Absolute 05/14/2020 0.8  0.1 - 1.0 K/uL Final  . Eosinophils Relative 05/14/2020 2  % Final  . Eosinophils Absolute 05/14/2020 0.1  0.0 - 0.5 K/uL Final  . Basophils Relative 05/14/2020 0  % Final  . Basophils Absolute 05/14/2020 0.0  0.0 - 0.1 K/uL Final  . Immature Granulocytes 05/14/2020 2  % Final  . Abs Immature Granulocytes 05/14/2020 0.14* 0.00 - 0.07 K/uL Final   Performed at [facility], [address], [place], [place] [address]    Assessment:  Jennifer Drake is a 35 y.o. female with microcytic anemia who is currently [redacted] weeks pregnant.  Limited labs are available.  MCV has ranged between 69-72.5.  Diet appears good.  She denies any bleeding.  Labs on 04/19/2020 revealed a hematocrit 30.9, hemoglobin 9.7, MCV 72.5, platelets 362,000, WBC 8600 with an ANC 6040.  Hemoglobin electrophoresis on 05/01/2020 was normal with a hemoglobin  F of 0%, hemoglobin A 97.7%, and hemoglobin A2 2.3%.  Ferritin was 40.  Urinalysis on 04/19/2020 revealed  no hematuria.  TSH on 01/03/2020 was 2.07 (normal).  Work-up on 05/14/2020 revealed a hematocrit of 31.2, hemoglobin 10.3, MCV 70.0, platelets 337,000, WBC 6,400 with an ANC of 4300. Retic was 2.2%.  Ferritin was 23 (low) with an iron saturation of 13% and a TIBC of 449. Vitamin B12 was 243 (low normal) with a folate of 21.0.   Peripheral smear revealed a normal leukocyte count and differential. There was microcytic, hypochromic anemia, with mild anisocytosis. Platelet count and morphology were normal.  She has iron deficiency.  Ferritin was 23 on 05/15/2020.  She is on oral iron.  She has B12 deficiency.  B12 was 243 on 05/15/2020.  She is on oral B12.  Symptomatically, she feels "fine".  She notes constipation.  Nosebleeds have resolved.  Diet is good.  She denies any bleeding.  Plan: 1.   Review work-up.  2.   Microcytic anemia             She has a history of microcytic anemia.                         Hemoglobin 11.7 with a MCV 68.0 on 07/12/2014.                         Hemoglobin   9.7 with a MCV 72.5 on 04/19/2020.   Hemoglobin 10.3 with a MCV 70.0 on 05/14/2020.  She has iron deficiency as well as possibly alpha thalassemia trait.   Alpha thalassemia genotype is pending. 3.   Iron deficiency  Ferritin was 23 on 05/14/2020.  Patient denies any bleeding.  Diet appears good.  Ferritin goal is 100.  She began oral iron on 05/15/2020. 4.   B12 deficiency  B12 was 243 on 05/14/2020.  B12 goal is 400.  She began oral B12 on 05/15/2020. 5.   RTC on 06/11/2020 for MD assessment and labs (CBC, ferritin, B12).  I discussed the assessment and treatment plan with the patient.  The patient was provided an opportunity to ask questions and all were answered.  The patient agreed with the plan and demonstrated an understanding of the instructions.  The patient was advised to call back or seek an in person evaluation if the symptoms worsen or if the condition fails to improve as  anticipated.   Rosey Bath, MD, PhD 05/21/2020, 6:28 PM   I, Pietro Cassis, am acting as scribe for [employer]. Merlene Pulling, MD, PhD.  I, Sadhana Frater C. Merlene Pulling, MD, have reviewed the above documentation for accuracy and completeness, and I agree with the above.

## 2020-05-21 ENCOUNTER — Encounter: Payer: Self-pay | Admitting: Hematology and Oncology

## 2020-05-21 ENCOUNTER — Inpatient Hospital Stay
Payer: No Typology Code available for payment source | Attending: Hematology and Oncology | Admitting: Hematology and Oncology

## 2020-05-21 ENCOUNTER — Other Ambulatory Visit: Payer: Self-pay

## 2020-05-21 VITALS — BP 120/58 | HR 100 | Temp 97.5°F | Resp 16 | Wt 193.5 lb

## 2020-05-21 DIAGNOSIS — D649 Anemia, unspecified: Secondary | ICD-10-CM

## 2020-05-21 DIAGNOSIS — O99613 Diseases of the digestive system complicating pregnancy, third trimester: Secondary | ICD-10-CM | POA: Diagnosis not present

## 2020-05-21 DIAGNOSIS — D509 Iron deficiency anemia, unspecified: Secondary | ICD-10-CM

## 2020-05-21 DIAGNOSIS — O99013 Anemia complicating pregnancy, third trimester: Secondary | ICD-10-CM | POA: Insufficient documentation

## 2020-05-21 DIAGNOSIS — Z79899 Other long term (current) drug therapy: Secondary | ICD-10-CM | POA: Diagnosis not present

## 2020-05-21 DIAGNOSIS — Z3A32 32 weeks gestation of pregnancy: Secondary | ICD-10-CM | POA: Diagnosis not present

## 2020-05-21 DIAGNOSIS — K5900 Constipation, unspecified: Secondary | ICD-10-CM | POA: Diagnosis not present

## 2020-05-21 DIAGNOSIS — E538 Deficiency of other specified B group vitamins: Secondary | ICD-10-CM | POA: Insufficient documentation

## 2020-05-21 NOTE — Progress Notes (Signed)
Patient here for oncology follow-up appointment, expresses no complaints or concerns at this time.    

## 2020-05-25 LAB — ALPHA-THALASSEMIA GENOTYPR

## 2020-06-07 NOTE — Progress Notes (Signed)
[facility]  [address] [place], [place] [address] Phone: [phone]  Fax: [phone]   Clinic day:  06/11/2020  Chief Complaint: Jennifer Drake is a 35 y.o. female with anemia complicating pregnancy in the third trimester who is seen for 2 week assessment.  HPI:  The patient was last seen in the hematology clinic on 05/21/2020. At that time, she felt "fine".  Nosebleeds had resolved.  Diet was good.  She denied any bleeding.  She was on oral iron and B12. Alpha-thalassemia genotype confirmed alpha-thalassemia minor, alpha-/alpha-.  During the interim, she has been "pretty good." The patient coughs after she eats and has had a little bit of blood in her phlegm. She reports gum bleeds. She is eating well and denies nose bleeds, ice pica, restless legs, nausea, vomiting, and diarrhea. Her lightheadedness and floaters in her vision have resolved. Her shortness of breath on exertion is stable. Her pelvic pain is stable and sometimes affects her balance.  She is currently [redacted]w[redacted]d pregnant and plans to deliver vaginally. The patient is taking oral iron once daily with orange juice or vitamin C. She takes oral B12 500 mcg/day.  [facility] labs on 12/18/2014: Hematocrit 34.8, hemoglobin 11.3, MCV 67.4, platelets 332,000, WBC 5,300.   Past Medical History:  Diagnosis Date  . Anemia     Past Surgical History:  Procedure Laterality Date  . CESAREAN SECTION    . LEEP      Family History  Problem Relation Age of Onset  . Glaucoma Mother   . Hypertension Father   . Gout Father   . Cancer Father     Social History:  reports that she has never smoked. She has never used smokeless tobacco. She reports previous alcohol use. She reports that she does not use drugs. She denies alcohol and tobacco use. She denies any exposure to radiation or toxins. She moved here from [place] in 2016. She does not work. She has 33 year old twins, and 32 year old, and an 33 year old.  The patient is alone today.  Allergies: No Known Allergies  Current Medications: Current Outpatient Medications  Medication Sig Dispense Refill  . Cyanocobalamin (VITAMIN B 12 PO) Take by mouth.    . docusate sodium (COLACE) 100 MG capsule Take 100 mg by mouth 2 (two) times daily.    . ferrous sulfate 325 (65 FE) MG tablet Take 325 mg by mouth daily with breakfast.    . Prenatal Vit-Fe Fumarate-FA (PRENATAL VITAMINS PO) Take by mouth.    . cyclobenzaprine (FLEXERIL) 10 MG tablet Take 10 mg by mouth 3 (three) times daily as needed for muscle spasms. (Patient not taking: Reported on 06/11/2020)    . Melatonin 3 MG TABS Take by mouth. (Patient not taking: Reported on 06/11/2020)    . naproxen (NAPROSYN) 125 MG/5ML suspension Take by mouth 2 (two) times daily with a meal. (Patient not taking: Reported on 06/11/2020)    . naproxen (NAPROSYN) 500 MG tablet Take 500 mg by mouth 2 (two) times daily with a meal. (Patient not taking: Reported on 06/11/2020)     No current facility-administered medications for this visit.    Review of Systems  Constitutional: Negative for chills, diaphoresis, fever, malaise/fatigue and weight loss (up 1 lb).       Feels "pretty good."  HENT: Negative for congestion, ear discharge, ear pain, hearing loss, nosebleeds, sinus pain, sore throat and tinnitus.        Occasional gum  bleeds  Eyes: Negative for blurred vision.  Respiratory: Positive for cough (after she eats), sputum production (phlegm with a little bit of blood) and shortness of breath (on exertion). Negative for hemoptysis.   Cardiovascular: Negative for chest pain, palpitations and leg swelling.  Gastrointestinal: Positive for constipation. Negative for abdominal pain, blood in stool, diarrhea, heartburn, melena, nausea and vomiting.       Eating well. No ice pica.  Genitourinary: Negative for dysuria, frequency, hematuria and urgency.       Pregnant. Pelvic pain.  Musculoskeletal: Negative for back pain,  joint pain, myalgias and neck pain.  Skin: Negative for itching and rash.  Neurological: Negative for dizziness, tingling, sensory change, weakness and headaches.       Balance problems.  Endo/Heme/Allergies: Does not bruise/bleed easily.  Psychiatric/Behavioral: Negative for depression and memory loss. The patient is not nervous/anxious and does not have insomnia.   All other systems reviewed and are negative.  Performance Status (ECOG): 1  Vital Signs: Blood pressure 123/75, pulse (!) 107, temperature (!) 97.3 F (36.3 C), temperature source Tympanic, resp. rate 18, weight 193 lb 7.3 oz (87.8 kg), last menstrual period 10/01/2019, SpO2 100 %.  Physical Exam Vitals and nursing note reviewed.  Constitutional:      General: She is not in acute distress.    Appearance: She is not diaphoretic.  HENT:     Head: Normocephalic and atraumatic.     Mouth/Throat:     Mouth: Mucous membranes are moist.     Pharynx: Oropharynx is clear.  Eyes:     General: No scleral icterus.    Extraocular Movements: Extraocular movements intact.     Conjunctiva/sclera: Conjunctivae normal.     Pupils: Pupils are equal, round, and reactive to light.  Cardiovascular:     Rate and Rhythm: Normal rate and regular rhythm.     Heart sounds: Normal heart sounds. No murmur heard.   Pulmonary:     Effort: Pulmonary effort is normal. No respiratory distress.     Breath sounds: Normal breath sounds. No wheezing or rales.  Chest:     Chest wall: No tenderness.  Breasts:     Right: No axillary adenopathy or supraclavicular adenopathy.     Left: No axillary adenopathy or supraclavicular adenopathy.    Abdominal:     General: Bowel sounds are normal. There is no distension.     Palpations: Abdomen is soft. There is no mass.     Tenderness: There is no abdominal tenderness. There is no guarding or rebound.     Comments: Pregnant  Musculoskeletal:        General: No swelling or tenderness. Normal range of  motion.     Cervical back: Normal range of motion and neck supple.  Lymphadenopathy:     Head:     Right side of head: No preauricular, posterior auricular or occipital adenopathy.     Left side of head: No preauricular, posterior auricular or occipital adenopathy.     Cervical: No cervical adenopathy.     Upper Body:     Right upper body: No supraclavicular or axillary adenopathy.     Left upper body: No supraclavicular or axillary adenopathy.     Lower Body: No right inguinal adenopathy. No left inguinal adenopathy.  Skin:    General: Skin is warm and dry.  Neurological:     Mental Status: She is alert and oriented to person, place, and time.  Psychiatric:  Behavior: Behavior normal.        Thought Content: Thought content normal.        Judgment: Judgment normal.    Appointment on 06/11/2020  Component Date Value Ref Range Status  . WBC 06/11/2020 5.8  4.0 - 10.5 K/uL Final  . RBC 06/11/2020 4.91  3.87 - 5.11 MIL/uL Final  . Hemoglobin 06/11/2020 11.0* 12.0 - 15.0 g/dL Final  . HCT 50/01/3817 34.5* 36.0 - 46.0 % Final  . MCV 06/11/2020 70.3* 80.0 - 100.0 fL Final  . MCH 06/11/2020 22.4* 26.0 - 34.0 pg Final  . MCHC 06/11/2020 31.9  30.0 - 36.0 g/dL Final  . RDW 29/93/7169 16.7* 11.5 - 15.5 % Final  . Platelets 06/11/2020 342  150 - 400 K/uL Final  . nRBC 06/11/2020 0.0  0.0 - 0.2 % Final   Performed at [facility], [address], [place], [place] [address]    Assessment:  Lynleigh Kovack is a 35 y.o. 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 She is currently [redacted] weeks pregnant.   MCV has ranged between 69-72.5.  Diet appears good.  She denies any bleeding.  She began oral iron on 05/15/2020  Labs on 04/19/2020 revealed a hematocrit 30.9, hemoglobin 9.7, MCV 72.5, platelets 362,000, WBC 8600 with an ANC 6040.  Hemoglobin electrophoresis on 05/01/2020 was normal with a hemoglobin F of 0%, hemoglobin A 97.7%, and hemoglobin A2 2.3%.   Ferritin was 40.  Urinalysis on 04/19/2020 revealed no hematuria.  TSH on 01/03/2020 was 2.07 (normal).  Work-up on 05/14/2020 revealed a hematocrit of 31.2, hemoglobin 10.3, MCV 70.0, platelets 337,000, WBC 6,400 with an ANC of 4300. Retic was 2.2%.  Ferritin was 23 (low) with an iron saturation of 13% and a TIBC of 449. Vitamin B12 was 243 (low normal) with a folate of 21.0.   Peripheral smear revealed a normal leukocyte count and differential. There was microcytic, hypochromic anemia, with mild anisocytosis. Platelet count and morphology were normal.  Alpha-thalassemia genotype confirmed alpha-thalassemia minor, alpha-/alpha-.  Symptomatically, she feels "pretty good." She reports gum bleeds. She is eating well and denies nose bleeds, ice pica, restless legs, nausea, vomiting, and diarrhea. Exam is normal.  Plan: 1.   Labs today: CBC, ferritin, B12 2.   Iron deficiency             Hematocrit 34.5. Hemoglobin 11.0. MCV 70.3 on 06/11/2020.    Ferritin 24.             Patient notes only minor gum bleeding. Diet is good..             Ferritin goal is 100.             Continue oral iron. 3.   B12 deficiency             B12 was 243 on 05/14/2020 and 323 on 06/11/2020.             B12 goal is 400.             Continue oral B12. 4.   Alpha-thalassemia minor  Discuss test results from 05/14/2020.  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  Clinically, usually silent (asymptomatic), but can result in microcytic RBC indices and mild anemia.  Discuss with ob-gyn regarding paternal testing.    Potential hematologic issues with baby were discussed with patient if her husband is a carrier.  Several questions asked and answered. 5.   RN:  Contact  OB-GYN to discuss thalassemia. 6.   RN:  Call patient with ferritin and B12 level. 7.   RTC in 2 months for MD assessment and labs (CBC with diff, ferritin, B12).  I discussed the assessment and treatment plan with the patient.  The patient was provided an  opportunity to ask questions and all were answered.  The patient agreed with the plan and demonstrated an understanding of the instructions.  The patient was advised to call back or seek an in person evaluation if the symptoms worsen or if the condition fails to improve as anticipated.  I provided 19 minutes of face-to-face time during this this encounter and > 50% was spent counseling as documented under my assessment and plan.   Rosey Bath, MD, PhD 06/11/2020,10:36 AM   I, Pietro Cassis, am acting as Neurosurgeon for [employer]. Merlene Pulling, MD, PhD.  I, Korrine Sicard C. Merlene Pulling, MD, have reviewed the above documentation for accuracy and completeness, and I agree with the above.

## 2020-06-11 ENCOUNTER — Inpatient Hospital Stay: Payer: No Typology Code available for payment source

## 2020-06-11 ENCOUNTER — Encounter: Payer: Self-pay | Admitting: Hematology and Oncology

## 2020-06-11 ENCOUNTER — Inpatient Hospital Stay (HOSPITAL_BASED_OUTPATIENT_CLINIC_OR_DEPARTMENT_OTHER): Payer: No Typology Code available for payment source | Admitting: Hematology and Oncology

## 2020-06-11 ENCOUNTER — Other Ambulatory Visit: Payer: Self-pay

## 2020-06-11 VITALS — BP 123/75 | HR 107 | Temp 97.3°F | Resp 18 | Wt 193.5 lb

## 2020-06-11 DIAGNOSIS — D509 Iron deficiency anemia, unspecified: Secondary | ICD-10-CM

## 2020-06-11 DIAGNOSIS — D563 Thalassemia minor: Secondary | ICD-10-CM | POA: Diagnosis not present

## 2020-06-11 DIAGNOSIS — E538 Deficiency of other specified B group vitamins: Secondary | ICD-10-CM

## 2020-06-11 DIAGNOSIS — D649 Anemia, unspecified: Secondary | ICD-10-CM

## 2020-06-11 LAB — CBC
HCT: 34.5 % — ABNORMAL LOW (ref 36.0–46.0)
Hemoglobin: 11 g/dL — ABNORMAL LOW (ref 12.0–15.0)
MCH: 22.4 pg — ABNORMAL LOW (ref 26.0–34.0)
MCHC: 31.9 g/dL (ref 30.0–36.0)
MCV: 70.3 fL — ABNORMAL LOW (ref 80.0–100.0)
Platelets: 342 10*3/uL (ref 150–400)
RBC: 4.91 MIL/uL (ref 3.87–5.11)
RDW: 16.7 % — ABNORMAL HIGH (ref 11.5–15.5)
WBC: 5.8 10*3/uL (ref 4.0–10.5)
nRBC: 0 % (ref 0.0–0.2)

## 2020-06-11 LAB — FERRITIN: Ferritin: 24 ng/mL (ref 11–307)

## 2020-06-11 LAB — VITAMIN B12: Vitamin B-12: 323 pg/mL (ref 180–914)

## 2020-06-11 NOTE — Progress Notes (Signed)
Patient here for oncology follow-up appointment, expresses no complaints or concerns at this time.    

## 2020-06-15 DIAGNOSIS — Z22330 Carrier of Group B streptococcus: Secondary | ICD-10-CM | POA: Insufficient documentation

## 2020-06-15 DIAGNOSIS — O368190 Decreased fetal movements, unspecified trimester, not applicable or unspecified: Secondary | ICD-10-CM | POA: Diagnosis present

## 2020-06-15 LAB — OB RESULTS CONSOLE RPR: RPR: NONREACTIVE

## 2020-06-15 LAB — OB RESULTS CONSOLE HIV ANTIBODY (ROUTINE TESTING): HIV: NONREACTIVE

## 2020-06-15 LAB — OB RESULTS CONSOLE GC/CHLAMYDIA
Chlamydia: NEGATIVE
Gonorrhea: NEGATIVE

## 2020-06-15 LAB — OB RESULTS CONSOLE GBS: GBS: NEGATIVE

## 2020-06-18 ENCOUNTER — Inpatient Hospital Stay
Admission: EM | Admit: 2020-06-18 | Discharge: 2020-06-20 | DRG: 785 | Disposition: A | Discharge status: Home / Self Care | Payer: No Typology Code available for payment source

## 2020-06-18 ENCOUNTER — Encounter: Admission: EM | Disposition: A | Discharge status: Home / Self Care | Payer: Self-pay | Source: Home / Self Care

## 2020-06-18 ENCOUNTER — Other Ambulatory Visit: Payer: Self-pay

## 2020-06-18 ENCOUNTER — Inpatient Hospital Stay: Payer: No Typology Code available for payment source | Admitting: Anesthesiology

## 2020-06-18 ENCOUNTER — Observation Stay: Payer: No Typology Code available for payment source

## 2020-06-18 DIAGNOSIS — D509 Iron deficiency anemia, unspecified: Secondary | ICD-10-CM | POA: Diagnosis present

## 2020-06-18 DIAGNOSIS — O368130 Decreased fetal movements, third trimester, not applicable or unspecified: Principal | ICD-10-CM | POA: Diagnosis present

## 2020-06-18 DIAGNOSIS — Z8616 Personal history of COVID-19: Secondary | ICD-10-CM

## 2020-06-18 DIAGNOSIS — O322XX0 Maternal care for transverse and oblique lie, not applicable or unspecified: Secondary | ICD-10-CM | POA: Diagnosis present

## 2020-06-18 DIAGNOSIS — O368190 Decreased fetal movements, unspecified trimester, not applicable or unspecified: Secondary | ICD-10-CM | POA: Diagnosis present

## 2020-06-18 DIAGNOSIS — D563 Thalassemia minor: Secondary | ICD-10-CM | POA: Diagnosis present

## 2020-06-18 DIAGNOSIS — Z3A36 36 weeks gestation of pregnancy: Secondary | ICD-10-CM | POA: Diagnosis not present

## 2020-06-18 DIAGNOSIS — O9902 Anemia complicating childbirth: Secondary | ICD-10-CM | POA: Diagnosis present

## 2020-06-18 DIAGNOSIS — Z302 Encounter for sterilization: Secondary | ICD-10-CM

## 2020-06-18 DIAGNOSIS — Z20822 Contact with and (suspected) exposure to covid-19: Secondary | ICD-10-CM | POA: Diagnosis present

## 2020-06-18 DIAGNOSIS — O288 Other abnormal findings on antenatal screening of mother: Secondary | ICD-10-CM | POA: Diagnosis present

## 2020-06-18 DIAGNOSIS — O34211 Maternal care for low transverse scar from previous cesarean delivery: Secondary | ICD-10-CM | POA: Diagnosis present

## 2020-06-18 LAB — CBC
HCT: 34 % — ABNORMAL LOW (ref 36.0–46.0)
Hemoglobin: 11 g/dL — ABNORMAL LOW (ref 12.0–15.0)
MCH: 23.4 pg — ABNORMAL LOW (ref 26.0–34.0)
MCHC: 32.4 g/dL (ref 30.0–36.0)
MCV: 72.3 fL — ABNORMAL LOW (ref 80.0–100.0)
Platelets: 302 10*3/uL (ref 150–400)
RBC: 4.7 MIL/uL (ref 3.87–5.11)
RDW: 16.8 % — ABNORMAL HIGH (ref 11.5–15.5)
WBC: 5.9 10*3/uL (ref 4.0–10.5)
nRBC: 0 % (ref 0.0–0.2)

## 2020-06-18 LAB — TYPE AND SCREEN
ABO/RH(D): O POS
Antibody Screen: NEGATIVE

## 2020-06-18 LAB — SAMPLE TO BLOOD BANK

## 2020-06-18 LAB — SARS CORONAVIRUS 2 BY RT PCR (HOSPITAL ORDER, PERFORMED IN ~~LOC~~ HOSPITAL LAB): SARS Coronavirus 2: NEGATIVE

## 2020-06-18 LAB — ABO/RH: ABO/RH(D): O POS

## 2020-06-18 SURGERY — Surgical Case
Anesthesia: Spinal

## 2020-06-18 MED ORDER — KETOROLAC TROMETHAMINE 30 MG/ML IJ SOLN
30.0000 mg | Freq: Four times a day (QID) | INTRAMUSCULAR | Status: DC | PRN
  Administered 2020-06-19: 30 mg via INTRAVENOUS
  Filled 2020-06-18: qty 1

## 2020-06-18 MED ORDER — NALBUPHINE HCL 10 MG/ML IJ SOLN
5.0000 mg | Freq: Once | INTRAMUSCULAR | Status: DC | PRN
Start: 2020-06-18 — End: 2020-06-20

## 2020-06-18 MED ORDER — EPHEDRINE SULFATE-NACL 50-0.9 MG/10ML-% IV SOSY
PREFILLED_SYRINGE | INTRAVENOUS | Status: DC | PRN
  Administered 2020-06-18 (×3): 5 mg via INTRAVENOUS

## 2020-06-18 MED ORDER — MORPHINE SULFATE (PF) 0.5 MG/ML IJ SOLN
INTRAMUSCULAR | Status: DC | PRN
  Administered 2020-06-18: .1 mg via INTRATHECAL

## 2020-06-18 MED ORDER — ACETAMINOPHEN 325 MG PO TABS: 650.0000 mg | ORAL_TABLET | ORAL | Status: DC | PRN

## 2020-06-18 MED ORDER — BUPIVACAINE LIPOSOME 1.3 % IJ SUSP
20.0000 mL | Freq: Once | INTRAMUSCULAR | Status: DC
  Filled 2020-06-18: qty 20

## 2020-06-18 MED ORDER — BUPIVACAINE HCL (PF) 0.5 % IJ SOLN
INTRAMUSCULAR | Status: AC
  Filled 2020-06-18: qty 30

## 2020-06-18 MED ORDER — SOD CITRATE-CITRIC ACID 500-334 MG/5ML PO SOLN
30.0000 mL | ORAL | Status: AC
  Administered 2020-06-18: 30 mL via ORAL
  Filled 2020-06-18: qty 15

## 2020-06-18 MED ORDER — PROPOFOL 10 MG/ML IV BOLUS
INTRAVENOUS | Status: AC
  Filled 2020-06-18: qty 20

## 2020-06-18 MED ORDER — ONDANSETRON HCL 4 MG/2ML IJ SOLN
INTRAMUSCULAR | Status: DC | PRN
  Administered 2020-06-18: 4 mg via INTRAVENOUS

## 2020-06-18 MED ORDER — LACTATED RINGERS IV BOLUS
1000.0000 mL | Freq: Once | INTRAVENOUS | Status: AC
  Administered 2020-06-18: 1000 mL via INTRAVENOUS

## 2020-06-18 MED ORDER — DIPHENHYDRAMINE HCL 50 MG/ML IJ SOLN: 12.5000 mg | INTRAMUSCULAR | Status: DC | PRN

## 2020-06-18 MED ORDER — OXYTOCIN-SODIUM CHLORIDE 30-0.9 UT/500ML-% IV SOLN
INTRAVENOUS | Status: DC | PRN
  Administered 2020-06-18: 299 mL via INTRAVENOUS
  Administered 2020-06-18: 1 mL via INTRAVENOUS

## 2020-06-18 MED ORDER — GLYCOPYRROLATE 0.2 MG/ML IJ SOLN
INTRAMUSCULAR | Status: AC
  Filled 2020-06-18: qty 1

## 2020-06-18 MED ORDER — CALCIUM CARBONATE ANTACID 500 MG PO CHEW: 2.0000 | CHEWABLE_TABLET | ORAL | Status: DC | PRN

## 2020-06-18 MED ORDER — FENTANYL CITRATE (PF) 100 MCG/2ML IJ SOLN
INTRAMUSCULAR | Status: AC
  Filled 2020-06-18: qty 2

## 2020-06-18 MED ORDER — MEPERIDINE HCL 50 MG/ML IJ SOLN: 6.2500 mg | INTRAMUSCULAR | Status: DC | PRN

## 2020-06-18 MED ORDER — SODIUM CHLORIDE 0.9% FLUSH: 3.0000 mL | INTRAVENOUS | Status: DC | PRN

## 2020-06-18 MED ORDER — FENTANYL CITRATE (PF) 100 MCG/2ML IJ SOLN
INTRAMUSCULAR | Status: DC | PRN
  Administered 2020-06-18: 20 ug via INTRATHECAL

## 2020-06-18 MED ORDER — BUPIVACAINE HCL (PF) 0.25 % IJ SOLN
INTRAMUSCULAR | Status: DC | PRN
  Administered 2020-06-18: 10 mL

## 2020-06-18 MED ORDER — NALOXONE HCL 0.4 MG/ML IJ SOLN: 0.4000 mg | INTRAMUSCULAR | Status: DC | PRN

## 2020-06-18 MED ORDER — NALBUPHINE HCL 10 MG/ML IJ SOLN
5.0000 mg | INTRAMUSCULAR | Status: DC | PRN
  Administered 2020-06-19: 5 mg via INTRAVENOUS
  Filled 2020-06-18: qty 1

## 2020-06-18 MED ORDER — NALOXONE HCL 4 MG/10ML IJ SOLN
1.0000 ug/kg/h | INTRAVENOUS | Status: DC | PRN
  Filled 2020-06-18: qty 5

## 2020-06-18 MED ORDER — SODIUM CHLORIDE 0.9 % IV SOLN
INTRAVENOUS | Status: DC | PRN
  Administered 2020-06-18: 40 ug/min via INTRAVENOUS

## 2020-06-18 MED ORDER — ONDANSETRON HCL 4 MG/2ML IJ SOLN: 4.0000 mg | Freq: Once | INTRAMUSCULAR | Status: DC | PRN

## 2020-06-18 MED ORDER — BUPIVACAINE IN DEXTROSE 0.75-8.25 % IT SOLN
INTRATHECAL | Status: DC | PRN
  Administered 2020-06-18: 1.6 mL via INTRATHECAL

## 2020-06-18 MED ORDER — LACTATED RINGERS IV SOLN: INTRAVENOUS | Status: DC | PRN

## 2020-06-18 MED ORDER — CEFAZOLIN SODIUM-DEXTROSE 2-4 GM/100ML-% IV SOLN
2.0000 g | INTRAVENOUS | Status: AC
  Administered 2020-06-18: 2 g via INTRAVENOUS
  Filled 2020-06-18: qty 100

## 2020-06-18 MED ORDER — KETOROLAC TROMETHAMINE 30 MG/ML IJ SOLN: 30.0000 mg | Freq: Four times a day (QID) | INTRAMUSCULAR | Status: DC | PRN

## 2020-06-18 MED ORDER — DIPHENHYDRAMINE HCL 25 MG PO CAPS
25.0000 mg | ORAL_CAPSULE | ORAL | Status: DC | PRN
  Administered 2020-06-19: 25 mg via ORAL
  Filled 2020-06-18: qty 1

## 2020-06-18 MED ORDER — NALBUPHINE HCL 10 MG/ML IJ SOLN: 5.0000 mg | INTRAMUSCULAR | Status: DC | PRN

## 2020-06-18 MED ORDER — ONDANSETRON HCL 4 MG/2ML IJ SOLN: 4.0000 mg | Freq: Three times a day (TID) | INTRAMUSCULAR | Status: DC | PRN

## 2020-06-18 MED ORDER — ACETAMINOPHEN 500 MG PO TABS
1000.0000 mg | ORAL_TABLET | Freq: Four times a day (QID) | ORAL | Status: AC
  Administered 2020-06-19 (×3): 1000 mg via ORAL
  Filled 2020-06-18 (×3): qty 2

## 2020-06-18 MED ORDER — SODIUM CHLORIDE 0.9% FLUSH
50.0000 mL | Freq: Once | INTRAVENOUS | Status: DC
  Filled 2020-06-18: qty 51

## 2020-06-18 MED ORDER — FENTANYL CITRATE (PF) 100 MCG/2ML IJ SOLN: 25.0000 ug | INTRAMUSCULAR | Status: DC | PRN

## 2020-06-18 MED ORDER — SODIUM CHLORIDE 0.9 % IV SOLN
INTRAVENOUS | Status: DC | PRN
  Administered 2020-06-18: 40 mL

## 2020-06-18 MED ORDER — OXYTOCIN-SODIUM CHLORIDE 30-0.9 UT/500ML-% IV SOLN
INTRAVENOUS | Status: AC
  Filled 2020-06-18: qty 500

## 2020-06-18 MED ORDER — MORPHINE SULFATE (PF) 0.5 MG/ML IJ SOLN
INTRAMUSCULAR | Status: AC
  Filled 2020-06-18: qty 10

## 2020-06-18 SURGICAL SUPPLY — 32 items
CANISTER SUCT 3000ML PPV (MISCELLANEOUS) ×2 IMPLANT
CHLORAPREP W/TINT 26 (MISCELLANEOUS) ×2 IMPLANT
COVER WAND RF STERILE (DRAPES) ×2 IMPLANT
DRSG TELFA 3X8 NADH (GAUZE/BANDAGES/DRESSINGS) ×2 IMPLANT
ELECT REM PT RETURN 9FT ADLT (ELECTROSURGICAL) ×2
ELECTRODE REM PT RTRN 9FT ADLT (ELECTROSURGICAL) ×1 IMPLANT
EXTRACTOR VACUUM KIWI (MISCELLANEOUS) ×2 IMPLANT
GAUZE SPONGE 4X4 12PLY STRL (GAUZE/BANDAGES/DRESSINGS) ×2 IMPLANT
GLOVE PROTEXIS LATEX SZ 7.5 (GLOVE) ×2 IMPLANT
GOWN STRL REUS W/ TWL LRG LVL3 (GOWN DISPOSABLE) ×3 IMPLANT
GOWN STRL REUS W/TWL LRG LVL3 (GOWN DISPOSABLE) ×3
MANIFOLD NEPTUNE II (INSTRUMENTS) ×2 IMPLANT
MAT PREVALON FULL STRYKER (MISCELLANEOUS) ×2 IMPLANT
NEEDLE HYPO 25GX1X1/2 BEV (NEEDLE) ×2 IMPLANT
NS IRRIG 1000ML POUR BTL (IV SOLUTION) ×2 IMPLANT
PACK C SECTION AR (MISCELLANEOUS) ×2 IMPLANT
PAD OB MATERNITY 4.3X12.25 (PERSONAL CARE ITEMS) ×2 IMPLANT
PAD PREP 24X41 OB/GYN DISP (PERSONAL CARE ITEMS) ×2 IMPLANT
PENCIL SMOKE ULTRAEVAC 22 CON (MISCELLANEOUS) ×2 IMPLANT
RETRACTOR TRAXI PANNICULUS (MISCELLANEOUS) ×1 IMPLANT
STAPLER INSORB 30 2030 C-SECTI (MISCELLANEOUS) ×2 IMPLANT
SUT 2-0 PL GUT LIGAPAK (SUTURE) ×2 IMPLANT
SUT MNCRL 4-0 (SUTURE) ×1
SUT MNCRL 4-0 27XMFL (SUTURE) ×1
SUT VIC AB 0 CT1 36 (SUTURE) ×6 IMPLANT
SUT VIC AB 0 CTX 36 (SUTURE) ×2
SUT VIC AB 0 CTX36XBRD ANBCTRL (SUTURE) ×2 IMPLANT
SUT VIC AB 2-0 SH 27 (SUTURE) ×2
SUT VIC AB 2-0 SH 27XBRD (SUTURE) ×2 IMPLANT
SUTURE MNCRL 4-0 27XMF (SUTURE) ×1 IMPLANT
SYR 30ML LL (SYRINGE) ×4 IMPLANT
TRAXI PANNICULUS RETRACTOR (MISCELLANEOUS) ×1

## 2020-06-18 NOTE — Progress Notes (Signed)
Patient transported to ultrasound.

## 2020-06-18 NOTE — Op Note (Signed)
Cesarean Section Procedure Note  Indications: non-reassuring fetal status and unstable uterine lie  Pre-operative Diagnosis:  1. Intrauterine pregnancy at [redacted]w[redacted]d;  2. Desires permanent sterilization;  3. Prior cesarean section 4. Nonreactive fetal testing with nonreassuring fetal heartrate with persistent late decels 5. Hx of covid infection in early pregnancy  Post-operative Diagnosis: same, delivered.  Procedure: 1. Repeat Low Transverse Cesarean Section through Pfannenstiel incision  2. Bilateral tubal sterilization using modified Parkland method, including fimbriated ends  Surgeon: Christeen Douglas, MD  Assistant(s):  Margaretmary Eddy, CNM   Anesthesia: Spinal anesthesia  Estimated Blood Loss:  630         Drains: Foley         Total IV Fluids: see anesthesia   Urine Output:         Specimens: portion of left and right tubes         Complications:  None; patient tolerated the procedure well.         Disposition: PACU - hemodynamically stable.         Condition: stable  Findings:  A female infant "Isaiah" in cephalic presentation. Amniotic fluid - Clear  Birth weight 3070 g.  Apgars of 7 and 9.   Intact placenta with a three-vessel cord.  Grossly normal uterus, tubes and ovaries bilaterally. Filmy intraabdominal adhesions were noted.  Procedure Details  The patient was taken to Operating Room, identified as the correct patient and the procedure verified as C-Section Delivery. A Time Out was held and the above information confirmed.  After induction of anesthesia, the patient was draped and prepped in the usual sterile manner. A Pfannenstiel incision was made and carried down through the subcutaneous tissue to the fascia. Fascial incision was made and extended transversely with the Mayo scissors. The fascia was separated from the underlying rectus tissue superiorly and inferiorly. The peritoneum was identified and entered bluntly. Peritoneal incision was extended  longitudinally. The utero-vesical peritoneal reflection was incised transversely and a bladder flap was created digitally.   A low transverse hysterotomy was made. The fetus was delivered atraumatically. The umbilical cord was clamped x2 and cut and the infant was handed to the awaiting pediatricians. The placenta was removed intact and appeared normal with a 3-vessel cord.   The uterus was exteriorized and cleared of all clot and debris. The hysterotomy was closed with running sutures of  0-Vicryl. A second imbricating layer was placed with the same suture. Excellent hemostasis was observed.   Attention was then turned to the tubal ligation. The left fallopian tube distinguished from the round ligament by identifying the fimbria and was grasped with a Babcock clamp in the midisthmic portion approximately 3 cm from the cornual region. It was then doubly ligated with 0-plain gut suture in a Parkland fashion. The tubal segment was excised with Metzenbaum scissors. Tubal ostea noted. The procedure was repeated on the right side. *Care was noted to examine both tubal sites in situ to ensure the sutures were intact and no bleeding was noted. The uterus was returned to the abdomen.  The pelvis was irrigated and again, excellent hemostasis was noted. The fascia was then reapproximated with running sutures of 0 Vicryl.  The skin was reapproximated with Insorb absorbable staples.  Exparel used in standard fashion along fascial and skin lines  Instrument, sponge, and needle counts were correct prior to the abdominal closure and at the conclusion of the case.   The patient tolerated the procedure well and was transferred to the recovery  room in stable condition.   Christeen Douglas, MD2/11/2020

## 2020-06-18 NOTE — Progress Notes (Addendum)
34yo [id number] at 36+4wks with a hx of prior c/s, covid in first trimester, BMI of 30, who is having decreased fetal movement and a nonreactive NST today. She was sent for extended monitoring and is having recurrent late decels without reactivity. Her u/s confirms a transverse fetal position.   After consideration, her tracing is not reactive and I would not be comfortable taking her off the monitor. She is contracting intermittently. With decreased fetal movement, prior cesarean and nonreassuring fetal testing without reactivity over the last 6 hours and late decels, we have discussed proceeding to cesarean delivery.  She had previously considered BTL, and I have offered this to her today. She is certain she wants no more babies, but hadn't decided on a permanent route for herself prior to today, and felt she would have more time. Her husband is willing to get a vasectomy, as they are sure they don't want another pregnancy. I have discussed that BTL are permanent and if she is uncertain we have very good forms of contraception. She is still considering, and if she is certain and asks for it, we will do BTL.  The risks of cesarean section discussed with the patient included but were not limited to: bleeding which may require transfusion or reoperation; infection which may require antibiotics; injury to bowel, bladder, ureters or other surrounding organs; injury to the fetus; need for additional procedures including hysterectomy in the event of a life-threatening hemorrhage; placental abnormalities wth subsequent pregnancies, incisional problems, thromboembolic phenomenon and other postoperative/anesthesia complications. The patient concurred with the proposed plan, giving informed written consent for the procedure.   Patient has been NPO since 1.30p she will remain NPO for procedure. Anesthesia and OR aware. Preoperative prophylactic antibiotics and SCDs ordered on call to the OR.  To OR when ready.

## 2020-06-18 NOTE — Transfer of Care (Signed)
Immediate Anesthesia Transfer of Care Note  Patient: Jennifer Drake  Procedure(s) Performed: CESAREAN SECTION  Patient Location: PACU  Anesthesia Type:Spinal  Level of Consciousness: awake, alert , oriented and patient cooperative  Airway & Oxygen Therapy: Patient Spontanous Breathing  Post-op Assessment: Report given to RN and Post -op Vital signs reviewed and stable  Post vital signs: Reviewed and stable  Last Vitals:  Vitals Value Taken Time  BP 109/70 06/18/20 2308  Temp    Pulse 79 06/18/20 2309  Resp 21 06/18/20 2309  SpO2 100 % 06/18/20 2309  Vitals shown include unvalidated device data.  Last Pain:  Vitals:   06/18/20 1904  TempSrc:   PainSc: 0-No pain         Complications: No complications documented.

## 2020-06-18 NOTE — Anesthesia Preprocedure Evaluation (Signed)
Anesthesia Evaluation  Patient identified by MRN, date of birth, ID band Patient awake    Reviewed: Allergy & Precautions, H&P , NPO status , Patient's Chart, lab work & pertinent test results, reviewed documented beta blocker date and time   Airway Mallampati: II  TM Distance: >3 FB Neck ROM: full    Dental no notable dental hx. (+) Teeth Intact   Pulmonary neg pulmonary ROS, Current Smoker,    Pulmonary exam normal breath sounds clear to auscultation       Cardiovascular Exercise Tolerance: Good negative cardio ROS   Rhythm:regular Rate:Normal     Neuro/Psych negative neurological ROS  negative psych ROS   GI/Hepatic negative GI ROS, Neg liver ROS,   Endo/Other  negative endocrine ROSdiabetes, Gestational  Renal/GU      Musculoskeletal   Abdominal   Peds  Hematology  (+) Blood dyscrasia, anemia ,   Anesthesia Other Findings   Reproductive/Obstetrics (+) Pregnancy                             Anesthesia Physical Anesthesia Plan  ASA: II and emergent  Anesthesia Plan: Spinal   Post-op Pain Management:    Induction:   PONV Risk Score and Plan: 3  Airway Management Planned:   Additional Equipment:   Intra-op Plan:   Post-operative Plan:   Informed Consent: I have reviewed the patients History and Physical, chart, labs and discussed the procedure including the risks, benefits and alternatives for the proposed anesthesia with the patient or authorized representative who has indicated his/her understanding and acceptance.       Plan Discussed with:   Anesthesia Plan Comments:         Anesthesia Quick Evaluation

## 2020-06-18 NOTE — H&P (Signed)
OB History & Physical   History of Present Illness:   Chief Complaint: Non-reactive NST in office   HPI:  Katura Eatherly is a 35 y.o. O1Y0737 female at [redacted]w[redacted]d dated by LMP of 10/06/2019 c/w [place] at 106d.  She presents to L&D for nonreactive NST in office today.   Imani states that she had decreased fetal movement on Saturday that improved the next day.  States that baby's movements are becoming more random and unpredictable.  Reports that he is having longer periods of being more "quiet" but then will move some.  She is able to get 10 kicks in 1 hour once a day but states he's "acting sleepy" for longer and longer each day.    Reports active fetal movement now but was decreased over the weekend Contractions: denies  LOF/SROM: denies Vaginal bleeding: denies   Pregnancy Issues: 1. Covid-19 infection in pregnancy - first trimester  2. Iron-deficiency anemia in pregnancy  3. Previous c/section x 1 for twins  4. History of LEEP   Patient Active Problem List   Diagnosis Date Noted  . History of 2019 novel coronavirus disease (COVID-19) 06/18/2020  . Decreased fetal movements affecting management of mother, antepartum 06/15/2020  . Alpha thalassemia minor 06/11/2020  . Iron deficiency anemia 05/21/2020  . B12 deficiency 05/21/2020  . Microcytic anemia 05/14/2020  . Supervision of normal pregnancy 12/28/2019  . Twin delivery by C-section 03/30/2014     Maternal Medical History:   Past Medical History:  Diagnosis Date  . Anemia     Past Surgical History:  Procedure Laterality Date  . CESAREAN SECTION    . LEEP      No Known Allergies  Prior to Admission medications   Medication Sig Start Date End Date Taking? Authorizing Provider  Cyanocobalamin (VITAMIN B 12 PO) Take by mouth.   Yes [provider]  docusate sodium (COLACE) 100 MG capsule Take 100 mg by mouth 2 (two) times daily.   Yes [provider]  ferrous sulfate 325 (65 FE) MG tablet Take 325 mg by  mouth daily with breakfast.   Yes [provider]  Prenatal Vit-Fe Fumarate-FA (PRENATAL VITAMINS PO) Take by mouth.   Yes [provider]  cyclobenzaprine (FLEXERIL) 10 MG tablet Take 10 mg by mouth 3 (three) times daily as needed for muscle spasms. Patient not taking: Reported on 06/11/2020    [provider]  Melatonin 3 MG TABS Take by mouth. Patient not taking: Reported on 06/11/2020    [provider]  naproxen (NAPROSYN) 125 MG/5ML suspension Take by mouth 2 (two) times daily with a meal. Patient not taking: Reported on 06/11/2020    [provider]  naproxen (NAPROSYN) 500 MG tablet Take 500 mg by mouth 2 (two) times daily with a meal. Patient not taking: Reported on 06/11/2020    [provider]     Prenatal care site:  [facility]  Social History: She  reports that she has never smoked. She has never used smokeless tobacco. She reports previous alcohol use. She reports that she does not use drugs.  Family History: family history includes Cancer in her father; Glaucoma in her mother; Gout in her father; Hypertension in her father.   Review of Systems: A full review of systems was performed and negative except as noted in the HPI.     Physical Exam:  Vital Signs: BP (!) 116/55 (BP Location: Left Arm)   Pulse (!) 104   Temp 98.1 F (  36.7 C) (Oral)   Resp 16   Ht 5\' 7"  (1.702 m)   Wt 88.5 kg   LMP 10/01/2019   BMI 30.54 kg/m  Physical Exam  General: no acute distress.  HEENT: normocephalic, atraumatic Heart: regular rate & rhythm.  No murmurs/rubs/gallops Lungs: clear to auscultation bilaterally, normal respiratory effort Abdomen: soft, gravid, non-tender;  EFW: 5 1/2 lbs  Pelvic: deferred    Extremities: non-tender, symmetric, no edema bilaterally.  DTRs: 2+/2+  Neurologic: Alert & oriented x 3.    Results for orders placed or performed during the hospital encounter of 06/18/20 (from the past 24 hour(s))   Sample to Blood Bank     Status: None   Collection Time: 06/18/20  3:42 PM  Result Value Ref Range   Blood Bank Specimen SAMPLE AVAILABLE FOR TESTING    Sample Expiration      06/21/2020,2359 Performed at [facility], [address] [place], [place]     Pertinent Results:  Prenatal Labs: Blood type/Rh O pos   Antibody screen neg  Rubella Immune  Varicella Immune  RPR NR  HBsAg Neg  HIV NR  GC neg  Chlamydia neg  Genetic screening Declined   1 hour GTT 122  3 hour GTT neg  GBS Neg   FHT: Baseline: 150 bpm, Variability: moderate, Accelerations: occasional 15x15 present, mostly 10x10 and Decelerations: occasional late decelerations present  TOCO: irregular mild contractions  SVE:  Deferred    Presentation: unstable lie - Cephalic in office today, transverse in L&D   [id number] OB Limited  Result Date: 06/18/2020 CLINICAL DATA:  35 year old pregnant female with decreased fetal movement in third trimester. EXAM: LIMITED OBSTETRIC ULTRASOUND AND BIOPHYSICAL PROFILE FINDINGS: Number of Fetuses: 1 Heart Rate:  135 bpm Movement: Yes Presentation: Transverse with fetal head on maternal Placental Location: Anterior fundal Previa: No Amniotic Fluid (Subjective):  Within normal limits. AFI: 9.0 cm (normal AFI range 7.7 to 24.9 for [redacted] week gestational age) BPD:  9.5cm 38w 4d MATERNAL FINDINGS: Cervix: Appears closed. Cervix length approximately 3.6 cm on transabdominal views. Uterus/Adnexae: No abnormality visualized. Movement:  2  Time: 26 minutes Breathing: 2 Tone:  2 Amniotic Fluid: 2 Total Score:  8 IMPRESSION: 1. Single living intrauterine gestation in transverse lie at 38 weeks 4 days by limited fetal biometry. No acute gestational abnormality demonstrated on this limited scan. 2. Biophysical profile score is 8 out of 8. This exam is performed on an emergent basis and does not comprehensively evaluate fetal size, dating, or anatomy; follow-up complete OB 34 should be  considered if further fetal assessment is warranted. Electronically Signed   By: [place] M.D.   On: 06/18/2020 14:26   08/16/2020 FETAL BPP W/NONSTRESS  Result Date: 06/18/2020 CLINICAL DATA:  35 year old pregnant female with decreased fetal movement in third trimester. EXAM: LIMITED OBSTETRIC ULTRASOUND AND BIOPHYSICAL PROFILE FINDINGS: Number of Fetuses: 1 Heart Rate:  135 bpm Movement: Yes Presentation: Transverse with fetal head on maternal Placental Location: Anterior fundal Previa: No Amniotic Fluid (Subjective):  Within normal limits. AFI: 9.0 cm (normal AFI range 7.7 to 24.9 for [redacted] week gestational age) BPD:  9.5cm 38w 4d MATERNAL FINDINGS: Cervix: Appears closed. Cervix length approximately 3.6 cm on transabdominal views. Uterus/Adnexae: No abnormality visualized. Movement:  2  Time: 26 minutes Breathing: 2 Tone:  2 Amniotic Fluid: 2 Total Score:  8 IMPRESSION: 1. Single living intrauterine gestation in transverse lie at 38 weeks 4 days by limited fetal biometry. No acute gestational  abnormality demonstrated on this limited scan. 2. Biophysical profile score is 8 out of 8. This exam is performed on an emergent basis and does not comprehensively evaluate fetal size, dating, or anatomy; follow-up complete OB US should be considered if further fetal assessment is warranted. Electronically Signed   By: Delbert Phenix M.D.   On: 06/18/2020 14:26    Assessment:  Crysten Kaman is a 35 y.o. [id number] female at [redacted]w[redacted]d with non reactive NST.   Plan:  1. Admit to Labor & Delivery; consents reviewed and obtained - Covid admission screen  - Plan discussed with Dr. Dalbert Garnet   2. Fetal Well being  - Fetal Tracing: Cat II for occasional late deceleration.  Tracing overall non-reactive with moderate variability.  10x10 present but only has occasional 15x15 accels.  - There are intermittent decreases in FHR that are not well defined and often occur after or with a contraction.   - Group B Streptococcus ppx indicated:  Neg - Presentation: Transverse, unstable lie  confirmed by [place] today    3. Routine OB: - Prenatal labs reviewed, as above - Rh pos - CBC, T&S, RPR on admit - NPO, IV fluids   4. Repeat LTCS - Contractions monitored with external toco  - Plan for continuous EFM until c/section    - NPO as of 1330 - Pain management per anesthesia  - SCN and neonatology notified   5. Post Partum Planning: - Infant feeding: TBD - Contraception: TBD - Tdap vaccine: given 05/31/2020 - Flu vaccine: declined   Gustavo Lah, CNM 06/18/20 6:51 PM  Margaretmary Eddy, CNM Certified Nurse Midwife [facility]

## 2020-06-18 NOTE — Progress Notes (Signed)
In O.R. 

## 2020-06-18 NOTE — Anesthesia Procedure Notes (Signed)
Spinal  Patient location during procedure: OB Start time: 06/18/2020 9:15 PM Staffing Performed: anesthesiologist  Preanesthetic Checklist Completed: patient identified, IV checked, site marked, risks and benefits discussed, surgical consent, monitors and equipment checked, pre-op evaluation and timeout performed Spinal Block Patient position: sitting Prep: DuraPrep Patient monitoring: heart rate, cardiac monitor, continuous pulse ox and blood pressure Approach: midline Location: L4-5 Injection technique: single-shot Needle Needle type: Sprotte  Needle gauge: 24 G Needle length: 10 cm Assessment Sensory level: T4 Additional Notes SAB placed without difficulty.  +CSF No Heme.  No paresthesia.  VSST and tolerated well.  JA

## 2020-06-19 ENCOUNTER — Encounter: Payer: Self-pay | Admitting: Obstetrics and Gynecology

## 2020-06-19 LAB — RPR: RPR Ser Ql: NONREACTIVE

## 2020-06-19 LAB — CBC
HCT: 31.5 % — ABNORMAL LOW (ref 36.0–46.0)
Hemoglobin: 10.1 g/dL — ABNORMAL LOW (ref 12.0–15.0)
MCH: 23.1 pg — ABNORMAL LOW (ref 26.0–34.0)
MCHC: 32.1 g/dL (ref 30.0–36.0)
MCV: 72.1 fL — ABNORMAL LOW (ref 80.0–100.0)
Platelets: 251 10*3/uL (ref 150–400)
RBC: 4.37 MIL/uL (ref 3.87–5.11)
RDW: 16.6 % — ABNORMAL HIGH (ref 11.5–15.5)
WBC: 10.2 10*3/uL (ref 4.0–10.5)
nRBC: 0 % (ref 0.0–0.2)

## 2020-06-19 MED ORDER — PRENATAL MULTIVITAMIN CH
1.0000 | ORAL_TABLET | Freq: Every day | ORAL | Status: DC
  Administered 2020-06-19 – 2020-06-20 (×2): 1 via ORAL
  Filled 2020-06-19 (×2): qty 1

## 2020-06-19 MED ORDER — ACETAMINOPHEN 500 MG PO TABS
1000.0000 mg | ORAL_TABLET | Freq: Four times a day (QID) | ORAL | Status: DC
  Administered 2020-06-19 – 2020-06-20 (×4): 1000 mg via ORAL
  Filled 2020-06-19 (×4): qty 2

## 2020-06-19 MED ORDER — MENTHOL 3 MG MT LOZG
1.0000 | LOZENGE | OROMUCOSAL | Status: DC | PRN
  Filled 2020-06-19: qty 9

## 2020-06-19 MED ORDER — OXYCODONE HCL 5 MG PO TABS
5.0000 mg | ORAL_TABLET | ORAL | Status: DC | PRN
  Administered 2020-06-19: 5 mg via ORAL
  Administered 2020-06-19: 10 mg via ORAL
  Administered 2020-06-19 – 2020-06-20 (×3): 5 mg via ORAL
  Filled 2020-06-19 (×4): qty 1
  Filled 2020-06-19: qty 2
  Filled 2020-06-19: qty 1

## 2020-06-19 MED ORDER — SENNOSIDES-DOCUSATE SODIUM 8.6-50 MG PO TABS
2.0000 | ORAL_TABLET | ORAL | Status: DC
  Administered 2020-06-20: 2 via ORAL
  Filled 2020-06-19: qty 2

## 2020-06-19 MED ORDER — NAPROXEN 500 MG PO TABS: 500.0000 mg | ORAL_TABLET | Freq: Two times a day (BID) | ORAL | Status: DC

## 2020-06-19 MED ORDER — LACTATED RINGERS IV SOLN: INTRAVENOUS | Status: DC

## 2020-06-19 MED ORDER — KETOROLAC TROMETHAMINE 30 MG/ML IJ SOLN
30.0000 mg | Freq: Four times a day (QID) | INTRAMUSCULAR | Status: AC
  Administered 2020-06-19 (×2): 30 mg via INTRAVENOUS
  Filled 2020-06-19 (×4): qty 1

## 2020-06-19 MED ORDER — CYCLOBENZAPRINE HCL 10 MG PO TABS
10.0000 mg | ORAL_TABLET | Freq: Three times a day (TID) | ORAL | Status: DC | PRN
  Filled 2020-06-19: qty 1

## 2020-06-19 MED ORDER — WITCH HAZEL-GLYCERIN EX PADS: 1.0000 "application " | MEDICATED_PAD | CUTANEOUS | Status: DC | PRN

## 2020-06-19 MED ORDER — DIPHENHYDRAMINE HCL 25 MG PO CAPS
25.0000 mg | ORAL_CAPSULE | ORAL | Status: DC | PRN
  Administered 2020-06-19 (×2): 25 mg via ORAL
  Filled 2020-06-19 (×2): qty 1

## 2020-06-19 MED ORDER — SIMETHICONE 80 MG PO CHEW
80.0000 mg | CHEWABLE_TABLET | Freq: Three times a day (TID) | ORAL | Status: DC
  Administered 2020-06-19 – 2020-06-20 (×6): 80 mg via ORAL
  Filled 2020-06-19 (×7): qty 1

## 2020-06-19 MED ORDER — POLYETHYLENE GLYCOL 3350 17 G PO PACK
17.0000 g | PACK | Freq: Every day | ORAL | Status: DC
  Administered 2020-06-19: 17 g via ORAL
  Filled 2020-06-19 (×2): qty 1

## 2020-06-19 MED ORDER — SIMETHICONE 80 MG PO CHEW: 80.0000 mg | CHEWABLE_TABLET | ORAL | Status: DC | PRN

## 2020-06-19 MED ORDER — DIBUCAINE (PERIANAL) 1 % EX OINT: 1.0000 "application " | TOPICAL_OINTMENT | CUTANEOUS | Status: DC | PRN

## 2020-06-19 MED ORDER — DOCUSATE SODIUM 100 MG PO CAPS
100.0000 mg | ORAL_CAPSULE | Freq: Two times a day (BID) | ORAL | Status: DC
  Administered 2020-06-19 – 2020-06-20 (×3): 100 mg via ORAL
  Filled 2020-06-19 (×3): qty 1

## 2020-06-19 MED ORDER — DIPHENHYDRAMINE HCL 25 MG PO CAPS: 25.0000 mg | ORAL_CAPSULE | Freq: Four times a day (QID) | ORAL | Status: DC | PRN

## 2020-06-19 MED ORDER — CYANOCOBALAMIN 500 MCG PO TABS
500.0000 ug | ORAL_TABLET | Freq: Every day | ORAL | Status: DC
  Administered 2020-06-19 – 2020-06-20 (×2): 500 ug via ORAL
  Filled 2020-06-19 (×2): qty 1

## 2020-06-19 MED ORDER — GABAPENTIN 300 MG PO CAPS
300.0000 mg | ORAL_CAPSULE | Freq: Every day | ORAL | Status: DC
  Administered 2020-06-19: 300 mg via ORAL
  Filled 2020-06-19 (×2): qty 1

## 2020-06-19 MED ORDER — BISACODYL 10 MG RE SUPP
10.0000 mg | Freq: Every day | RECTAL | Status: DC | PRN
  Administered 2020-06-20: 10 mg via RECTAL
  Filled 2020-06-19: qty 1

## 2020-06-19 MED ORDER — DIPHENHYDRAMINE HCL 50 MG/ML IJ SOLN: 12.5000 mg | INTRAMUSCULAR | Status: DC | PRN

## 2020-06-19 MED ORDER — OXYTOCIN-SODIUM CHLORIDE 30-0.9 UT/500ML-% IV SOLN
INTRAVENOUS | Status: AC
  Filled 2020-06-19: qty 500

## 2020-06-19 MED ORDER — ENOXAPARIN SODIUM 40 MG/0.4ML ~~LOC~~ SOLN
40.0000 mg | SUBCUTANEOUS | Status: AC
  Administered 2020-06-19: 40 mg via SUBCUTANEOUS
  Filled 2020-06-19: qty 0.4

## 2020-06-19 MED ORDER — COCONUT OIL OIL: 1.0000 "application " | TOPICAL_OIL | Status: DC | PRN

## 2020-06-19 MED ORDER — FERROUS SULFATE 325 (65 FE) MG PO TABS: 325.0000 mg | ORAL_TABLET | Freq: Every day | ORAL | Status: DC

## 2020-06-19 MED ORDER — TETANUS-DIPHTH-ACELL PERTUSSIS 5-2.5-18.5 LF-MCG/0.5 IM SUSY
0.5000 mL | PREFILLED_SYRINGE | Freq: Once | INTRAMUSCULAR | Status: DC
  Filled 2020-06-19: qty 0.5

## 2020-06-19 MED ORDER — IBUPROFEN 800 MG PO TABS
800.0000 mg | ORAL_TABLET | Freq: Four times a day (QID) | ORAL | Status: DC
  Administered 2020-06-19 – 2020-06-20 (×2): 800 mg via ORAL
  Filled 2020-06-19: qty 1

## 2020-06-19 MED ORDER — FERROUS SULFATE 325 (65 FE) MG PO TABS
325.0000 mg | ORAL_TABLET | Freq: Two times a day (BID) | ORAL | Status: DC
  Administered 2020-06-19 – 2020-06-20 (×4): 325 mg via ORAL
  Filled 2020-06-19 (×4): qty 1

## 2020-06-19 MED ORDER — OXYTOCIN-SODIUM CHLORIDE 30-0.9 UT/500ML-% IV SOLN: 2.5000 [IU]/h | INTRAVENOUS | Status: AC

## 2020-06-19 MED ORDER — FLEET ENEMA 7-19 GM/118ML RE ENEM: 1.0000 | ENEMA | Freq: Every day | RECTAL | Status: DC | PRN

## 2020-06-19 MED ORDER — MEASLES, MUMPS & RUBELLA VAC IJ SOLR
0.5000 mL | Freq: Once | INTRAMUSCULAR | Status: DC
  Filled 2020-06-19: qty 0.5

## 2020-06-19 NOTE — Lactation Note (Signed)
This note was copied from a baby's chart. Lactation Consultation Note  Patient Name: Jennifer Drake [id number] Date: 06/19/2020 Reason for consult: Late-preterm 34-36.6wks;Initial assessment Age:33 hours  Lactation and Student to the room for initial visit. Mother is holding the baby skin to skin after the bath. Encouraged feeding on demand and with cues. If baby is not cueing encouraged hand expression and skin to skin. Taught proper technique for hand expression and spoon feeding. LC and Mother expressed and fed about 76ml to baby. Baby tolerated spoon feed well. Baby was left skin to skin with Mother. Mother's twins were 37 weeks at birth and she BF them for about a month and then pumped. She stated they were aggressive feeders. Encouraged 8 or more attempts in the first 24 hours and 8 or more good feeds after 24 HOL. Reviewed baby's gestational age and if he does not continue to feed well after 24 HOL she may need to start pumping and supplement. Encouraged hand expression and spoon feed after breast. Reviewed appropriate diapers for days of life and How to know your baby is getting enough to eat. Reviewed "Understanding Postpartum and Newborn Care" booklet at bedside. [facility] # left on board, encouraged to call for any assistance. Mother has no further questions at this time.   Maternal Data Has patient been taught Hand Expression?: Yes Does the patient have breastfeeding experience prior to this delivery?: Yes How long did the patient breastfeed?: Breastfed her twins for two months per mom  Feeding Mother's Current Feeding Choice: Breast Milk   Lactation Tools Discussed/Used    Interventions Interventions: Hand express;Expressed milk  Discharge Pump: Personal (Has a Medela at home)  Consult Status Consult Status: Follow-up Date: 06/19/20 Follow-up type: In-patient    Jennifer Drake D Jennifer Drake 06/19/2020, 5:18 PM

## 2020-06-19 NOTE — Anesthesia Postprocedure Evaluation (Signed)
Anesthesia Post Note  Patient: Tree surgeon  Procedure(s) Performed: CESAREAN SECTION  Patient location during evaluation: Mother Baby Anesthesia Type: Spinal Level of consciousness: oriented and awake and alert Pain management: pain level controlled Vital Signs Assessment: post-procedure vital signs reviewed and stable Respiratory status: spontaneous breathing and respiratory function stable Cardiovascular status: blood pressure returned to baseline and stable Postop Assessment: no headache, no backache, no apparent nausea or vomiting and able to ambulate Anesthetic complications: no   No complications documented.   Last Vitals:  Vitals:   06/19/20 0303 06/19/20 0734  BP: 112/65 111/75  Pulse: 78 90  Resp:  18  Temp: 36.7 C 36.7 C  SpO2: 98% 98%    Last Pain:  Vitals:   06/19/20 0734  TempSrc: Oral  PainSc:                  Karoline Caldwell

## 2020-06-19 NOTE — Progress Notes (Signed)
Post Op/Postpartum Day 1 Subjective: Doing well, no complaints.  Tolerating regular diet, pain with PO meds. Has not been OOB yet, foley remains in place draining clear yellow urine.   No CP SOB Fever,Chills, N/V or leg pain; denies nipple or breast pain no HA change of vision, RUQ/epigastric pain  Objective: BP 111/75 (BP Location: Right Arm)   Pulse 90   Temp 98 F (36.7 C) (Oral)   Resp 18   Ht 5\' 7"  (1.702 m)   Wt 88.5 kg   LMP 10/01/2019   SpO2 98%   Breastfeeding Unknown   BMI 30.54 kg/m    Physical Exam:  General: NAD Breasts: soft/nontender CV: RRR Pulm: nl effort, CTABL Abdomen: soft, NT, BS x 4 Incision: Pressure Dsg CDI Lochia: moderate Uterine Fundus: fundus firm and at umbilicus DVT Evaluation: no cords, ttp LEs   Recent Labs    06/18/20 1642 06/19/20 0505  HGB 11.0* 10.1*  HCT 34.0* 31.5*  WBC 5.9 10.2  PLT 302 251    Assessment/Plan: 35 y.o. 34 postpartum day # 1  - Continue routine PP care - Lactation consult prn - BTL done with surgery.   - iron deficiency and b12 anemia - hemodynamically stable and asymptomatic; start po ferrous sulfate BID with stool softeners, restart PO B12 supplement.       Disposition: Does not desire Dc home today.     [id number], CNM 06/19/2020  10:32 AM

## 2020-06-19 NOTE — Anesthesia Post-op Follow-up Note (Signed)
  Anesthesia Pain Follow-up Note  Patient: Jennifer Drake  Day #: 1  Date of Follow-up: 06/19/2020 Time: 8:09 AM  Last Vitals:  Vitals:   06/19/20 0303 06/19/20 0734  BP: 112/65 111/75  Pulse: 78 90  Resp:  18  Temp: 36.7 C 36.7 C  SpO2: 98% 98%    Level of Consciousness: alert  Pain: none   Side Effects:None  Catheter Site Exam:clean, dry, no drainage  Anti-Coag Meds (From admission, onward)   Start     Dose/Rate Route Frequency Ordered Stop   06/19/20 1200  enoxaparin (LOVENOX) injection 40 mg        40 mg Subcutaneous Every 24 hours 06/19/20 0109 06/20/20 1159       Plan: D/C from anesthesia care at surgeon's request  Tyrone Balash Lawerance Cruel

## 2020-06-20 LAB — CBC
HCT: 28.8 % — ABNORMAL LOW (ref 36.0–46.0)
Hemoglobin: 9.5 g/dL — ABNORMAL LOW (ref 12.0–15.0)
MCH: 23.4 pg — ABNORMAL LOW (ref 26.0–34.0)
MCHC: 33 g/dL (ref 30.0–36.0)
MCV: 70.9 fL — ABNORMAL LOW (ref 80.0–100.0)
Platelets: 281 10*3/uL (ref 150–400)
RBC: 4.06 MIL/uL (ref 3.87–5.11)
RDW: 16.5 % — ABNORMAL HIGH (ref 11.5–15.5)
WBC: 12 10*3/uL — ABNORMAL HIGH (ref 4.0–10.5)
nRBC: 0 % (ref 0.0–0.2)

## 2020-06-20 LAB — SURGICAL PATHOLOGY

## 2020-06-20 MED ORDER — IBUPROFEN 800 MG PO TABS: 800.0000 mg | ORAL_TABLET | Freq: Four times a day (QID) | ORAL | 1 refills | Status: AC | PRN

## 2020-06-20 MED ORDER — ACETAMINOPHEN 500 MG PO TABS: 1000.0000 mg | ORAL_TABLET | Freq: Four times a day (QID) | ORAL | 1 refills | Status: AC | PRN

## 2020-06-20 MED ORDER — IBUPROFEN 800 MG PO TABS
800.0000 mg | ORAL_TABLET | Freq: Four times a day (QID) | ORAL | Status: DC
  Administered 2020-06-20: 800 mg via ORAL
  Filled 2020-06-20: qty 1

## 2020-06-20 MED ORDER — OXYCODONE HCL 5 MG PO TABS: 5.0000 mg | ORAL_TABLET | ORAL | 0 refills | Status: AC | PRN

## 2020-06-20 NOTE — Progress Notes (Signed)
Mother discharged.  Discharge instructions given.  All questions answered.  Mother verbalizes understanding.  Will be transported once patient finishes dinner.

## 2020-06-20 NOTE — Lactation Note (Signed)
This note was copied from a baby's chart. Lactation Consultation Note  Patient Name: Jennifer Drake Date: 06/20/2020 Reason for consult: Follow-up assessment;Late-preterm 34-36.6wks Age:33 hours  Lactation follow-up. Mom planning on discharge today. Parents and Pediatrician noting difficulty with latching, when asked, mom feels baby is just getting the nipple. Currently the parents are following up with formula post feedings at the breast, mom not pumping a large volume at this time. LC discussed with mom sandwiching of the breast tissue, tips for wide open mouth, and position/alignment of baby as ways to ensure deep latch and promote adequate transfer.  Reviewed hand pump parts/pieces, use of pump, and need for consistent use due to infants age and possibility of tiring out at the breast. Parents verbalized understanding; mom has her own DEBP at home. Mom plans to call with next feeding before discharge for assistance with deep latch. Whiteboard updated with name/contact number.  Maternal Data Has patient been taught Hand Expression?: Yes Does the patient have breastfeeding experience prior to this delivery?: Yes How long did the patient breastfeed?: 1-2 months  Feeding Mother's Current Feeding Choice: Breast Milk  LATCH Score                    Lactation Tools Discussed/Used Tools: Pump Breast pump type: Double-Electric Breast Pump Reason for Pumping: baby's age  Interventions Interventions: Breast feeding basics reviewed;Hand pump;DEBP;Skin to skin  Discharge Pump: DEBP;Personal (Medela through insurance)  Consult Status Consult Status: PRN Date: 06/20/20 Follow-up type: Call as needed    Danford Bad 06/20/2020, 10:18 AM

## 2020-06-20 NOTE — Discharge Instructions (Signed)
Cesarean Delivery, Care After This sheet gives you information about how to care for yourself after your procedure. Your health care provider may also give you more specific instructions. If you have problems or questions, contact your health care provider. What can I expect after the procedure? After the procedure, it is common to have:  A small amount of blood or clear fluid coming from the incision.  Some redness, swelling, and pain in your incision area.  Some abdominal pain and soreness.  Vaginal bleeding (lochia). Even though you did not have a vaginal delivery, you will still have vaginal bleeding and discharge.  Pelvic cramps.  Fatigue. You may have pain, swelling, and discomfort in the tissue between your vagina and your anus (perineum) if:  Your C-section was unplanned, and you were allowed to labor and push.  An incision was made in the area (episiotomy) or the tissue tore during attempted vaginal delivery. Follow these instructions at home: Incision care  Follow instructions from your health care provider about how to take care of your incision. Make sure you: ? Wash your hands with soap and water before you change your bandage (dressing). If soap and water are not available, use hand sanitizer. ? If you have a dressing, change it or remove it as told by your health care provider. ? Leave stitches (sutures), skin staples, skin glue, or adhesive strips in place. These skin closures may need to stay in place for 2 weeks or longer. If adhesive strip edges start to loosen and curl up, you may trim the loose edges. Do not remove adhesive strips completely unless your health care provider tells you to do that.  Check your incision area every day for signs of infection. Check for: ? More redness, swelling, or pain. ? More fluid or blood. ? Warmth. ? Pus or a bad smell.  Do not take baths, swim, or use a hot tub until your health care provider says it's okay. Ask your health  care provider if you can take showers.  When you cough or sneeze, hug a pillow. This helps with pain and decreases the chance of your incision opening up (dehiscing). Do this until your incision heals.   Medicines  Take over-the-counter and prescription medicines only as told by your health care provider.  If you were prescribed an antibiotic medicine, take it as told by your health care provider. Do not stop taking the antibiotic even if you start to feel better.  Do not drive or use heavy machinery while taking prescription pain medicine. Lifestyle  Do not drink alcohol. This is especially important if you are breastfeeding or taking pain medicine.  Do not use any products that contain nicotine or tobacco, such as cigarettes, e-cigarettes, and chewing tobacco. If you need help quitting, ask your health care provider. Eating and drinking  Drink at least 8 eight-ounce glasses of water every day unless told not to by your health care provider. If you breastfeed, you may need to drink even more water.  Eat high-fiber foods every day. These foods may help prevent or relieve constipation. High-fiber foods include: ? Whole grain cereals and breads. ? Brown rice. ? Beans. ? Fresh fruits and vegetables. Activity  If possible, have someone help you care for your baby and help with household activities for at least a few days after you leave the hospital.  Return to your normal activities as told by your health care provider. Ask your health care provider what activities are safe for   you.  Rest as much as possible. Try to rest or take a nap while your baby is sleeping.  Do not lift anything that is heavier than 10 lbs (4.5 kg), or the limit that you were told, until your health care provider says that it is safe.  Talk with your health care provider about when you can engage in sexual activity. This may depend on your: ? Risk of infection. ? How fast you heal. ? Comfort and desire to  engage in sexual activity.   General instructions  Do not use tampons or douches until your health care provider approves.  Wear loose, comfortable clothing and a supportive and well-fitting bra.  Keep your perineum clean and dry. Wipe from front to back when you use the toilet.  If you pass a blood clot, save it and call your health care provider to discuss. Do not flush blood clots down the toilet before you get instructions from your health care provider.  Keep all follow-up visits for you and your baby as told by your health care provider. This is important. Contact a health care provider if:  You have: ? A fever. ? Bad-smelling vaginal discharge. ? Pus or a bad smell coming from your incision. ? Difficulty or pain when urinating. ? A sudden increase or decrease in the frequency of your bowel movements. ? More redness, swelling, or pain around your incision. ? More fluid or blood coming from your incision. ? A rash. ? Nausea. ? Little or no interest in activities you used to enjoy. ? Questions about caring for yourself or your baby.  Your incision feels warm to the touch.  Your breasts turn red or become painful or hard.  You feel unusually sad or worried.  You vomit.  You pass a blood clot from your vagina.  You urinate more than usual.  You are dizzy or light-headed. Get help right away if:  You have: ? Pain that does not go away or get better with medicine. ? Chest pain. ? Difficulty breathing. ? Blurred vision or spots in your vision. ? Thoughts about hurting yourself or your baby. ? New pain in your abdomen or in one of your legs. ? A severe headache.  You faint.  You bleed from your vagina so much that you fill more than one sanitary pad in one hour. Bleeding should not be heavier than your heaviest period. Summary  After the procedure, it is common to have pain at your incision site, abdominal cramping, and slight bleeding from your vagina.  Check  your incision area every day for signs of infection.  Tell your health care provider about any unusual symptoms.  Keep all follow-up visits for you and your baby as told by your health care provider. This information is not intended to replace advice given to you by your health care provider. Make sure you discuss any questions you have with your health care provider. Document Revised: 11/04/2017 Document Reviewed: 11/04/2017 Elsevier Patient Education  2021 [employer] Inc[employer]  

## 2020-06-20 NOTE — Discharge Summary (Signed)
Obstetrical Discharge Summary  Patient Name: Jennifer Drake DOB: May 14, 1985 MRN: [id number]  Date of Admission: 06/18/2020 Date of Delivery: 06/18/2020 Delivered by: Dr. Christeen Douglas  Date of Discharge: 06/20/2020  Primary OB: [facility] [id number] last menstrual period was 10/01/2019. EDC Estimated Date of Delivery: 07/12/20 Gestational Age at Delivery: [redacted]w[redacted]d   Antepartum complications:  1.   Admitting Diagnosis: Nonreactive NST and decreased fetal movement  Secondary Diagnosis: Patient Active Problem List   Diagnosis Date Noted  . History of 2019 novel coronavirus disease (COVID-19) 06/18/2020  . Non-reactive NST (non-stress test) 06/18/2020  . Decreased fetal movements affecting management of mother, antepartum 06/15/2020  . Alpha thalassemia minor 06/11/2020  . Iron deficiency anemia 05/21/2020  . B12 deficiency 05/21/2020  . Microcytic anemia 05/14/2020  . Supervision of normal pregnancy 12/28/2019  . Twin delivery by C-section 03/30/2014    Augmentation: N/A Complications: None Intrapartum complications/course: Jennifer Drake presented to L&D d/t a nonreactive NST in the office and decreased fetal movement.  She reports that baby is active when he moves but feels like he's "sleeping longer and longer each day."  NST was nonreactive in L&D though overall reassuring with occasional accels and moderate variability.  Occasional late decelerations were noted.  Discussed with Dr. Dalbert Garnet, Jennifer Drake, and her spouse about the option to stay on continuous monitoring for continued surveillance or proceed with a repeat c/section d/t nonreactive NST and unstable lie.  After considering her options, Jennifer Drake would like to proceed with a repeat c/section and BTL.  Please see OP note for further details.  Delivery Type: repeat cesarean section, low transverse incision Anesthesia: Spinal  Placenta: Manual  Laceration: N/A  Episiotomy: none Newborn Data: Live born female  Birth Weight: 6 lb  12.3 oz (3070 g) APGAR: 7, 9  Newborn Delivery   Birth date/time: 06/18/2020 21:58:00 Delivery type: C-Section, Low Transverse Trial of labor: No C-section categorization: Repeat     Postpartum Procedures: None  Edinburgh:  Edinburgh Postnatal Depression Scale Screening Tool 06/20/2020 06/19/2020 06/19/2020  I have been able to laugh and see the funny side of things. 0 0 (No Data)  I have looked forward with enjoyment to things. 0 0 -  I have blamed myself unnecessarily when things went wrong. 0 0 -  I have been anxious or worried for no good reason. 0 0 -  I have felt scared or panicky for no good reason. 0 0 -  Things have been getting on top of me. 1 1 -  I have been so unhappy that I have had difficulty sleeping. 0 0 -  I have felt sad or miserable. 1 1 -  I have been so unhappy that I have been crying. 1 1 -  The thought of harming myself has occurred to me. 0 0 -  Edinburgh Postnatal Depression Scale Total 3 3 -     Post partum course:  Patient had an uncomplicated postpartum course.  By time of discharge on POD#2, her pain was controlled on oral pain medications; she had appropriate lochia and was ambulating, voiding without difficulty, tolerating regular diet and passing flatus.   She was deemed stable for discharge to home.    Discharge Physical Exam:  BP 115/70 (BP Location: Right Arm)   Pulse 100   Temp 97.7 F (36.5 C) (Oral)   Resp 18   Ht 5\' 7"  (1.702 m)   Wt 88.5 kg   LMP 10/01/2019   SpO2 100%   Breastfeeding Unknown   BMI  30.54 kg/m   General: NAD CV: RRR Pulm: CTABL, nl effort ABD: s/nd/nt, fundus firm and below the umbilicus Lochia: moderate Perineum:minimal edema/intact Incision: c/d/I DVT Evaluation: LE non-ttp, no evidence of DVT on exam.  Hemoglobin  Date Value Ref Range Status  06/20/2020 9.5 (L) 12.0 - 15.0 g/dL Final   HCT  Date Value Ref Range Status  06/20/2020 28.8 (L) 36.0 - 46.0 % Final     Disposition: stable, discharge to  home. Baby Feeding: breastmilk and formula Baby Disposition: home with mom  Rh Immune globulin given: Rh pos Rubella vaccine given: Immune Varivax vaccine given: Immune 2 Flu vaccine given in AP or PP setting: declined  Tdap vaccine given in AP or PP setting: given 05/31/2020  Contraception: s/p BTL  Prenatal Labs:  Blood type/Rh O pos  Antibody screen neg  Rubella Immune  Varicella Immune  RPR NR  HBsAg Neg  HIV NR  GC neg  Chlamydia neg  Genetic screening Declined  1 hour GTT 122  3 hour GTT neg  GBS Neg    Plan:  Jennifer Drake was discharged to home in good condition. Follow-up appointment with delivering provider in 2 weeks.  Discharge Medications: Allergies as of 06/20/2020   No Known Allergies     Medication List    STOP taking these medications   naproxen 125 MG/5ML suspension Commonly known as: NAPROSYN   naproxen 500 MG tablet Commonly known as: NAPROSYN     TAKE these medications   acetaminophen 500 MG tablet Commonly known as: TYLENOL Take 2 tablets (1,000 mg total) by mouth every 6 (six) hours as needed.   cyclobenzaprine 10 MG tablet Commonly known as: FLEXERIL Take 10 mg by mouth 3 (three) times daily as needed for muscle spasms.   docusate sodium 100 MG capsule Commonly known as: COLACE Take 100 mg by mouth 2 (two) times daily.   ferrous sulfate 325 (65 FE) MG tablet Take 325 mg by mouth daily with breakfast.   ibuprofen 800 MG tablet Commonly known as: ADVIL Take 1 tablet (800 mg total) by mouth every 6 (six) hours as needed.   melatonin 3 MG Tabs tablet Take by mouth.   oxyCODONE 5 MG immediate release tablet Commonly known as: Oxy IR/ROXICODONE Take 1 tablet (5 mg total) by mouth every 4 (four) hours as needed for up to 7 days for moderate pain or severe pain.   PRENATAL VITAMINS PO Take by mouth.   VITAMIN B 12 PO Take by mouth.        Follow-up Information    Christeen Douglas, MD In 2 weeks.   Specialty: Obstetrics and  Gynecology Why: For postop check, can see Jennifer Drake Contact information: [address] [place] [address] [phone]               Signed:  Margaretmary Drake, CNM Certified Nurse Midwife [facility]

## 2020-06-20 NOTE — Progress Notes (Signed)
Post OP Day 2  Subjective: no complaints, up ad lib, voiding and tolerating PO  Doing well, no concerns. Ambulating without difficulty, pain managed with PO meds, tolerating regular diet, and voiding without difficulty.   No fever/chills, chest pain, shortness of breath, nausea/vomiting, or leg pain. No nipple or breast pain. No headache, visual changes, or RUQ/epigastric pain.  Objective: BP 124/66 (BP Location: Right Arm)   Pulse 87   Temp 97.7 F (36.5 C) (Oral)   Resp 20   Ht 5\' 7"  (1.702 m)   Wt 88.5 kg   LMP 10/01/2019   SpO2 100%   Breastfeeding Unknown   BMI 30.54 kg/m    Physical Exam:  General: alert, cooperative and no distress Breasts: soft/nontender CV: RRR Pulm: nl effort, CTABL Abdomen: soft, non-tender, active bowel sounds Uterine Fundus: firm Incision: no significant drainage Perineum: minimal edema, intact Lochia: appropriate DVT Evaluation: No evidence of DVT seen on physical exam.  Recent Labs    06/19/20 0505 06/20/20 0625  HGB 10.1* 9.5*  HCT 31.5* 28.8*  WBC 10.2 12.0*  PLT 251 281    Assessment/Plan: 35 y.o. 34 postpartum day # 2  -Continue routine postpartum care -Lactation consult PRN for breastfeeding  -Breastfeeding and pumping d/t late preterm gestation   -S/P BTL -Iron deficiency anemia and B12 deficiency.  Continue iron supplements and B12 Supplements.   Disposition: Desires discharge home today.  Infant pending car seat test.  Will consider discharge later this afternoon if infant does well with car seat test.     LOS: 2 days   [id number], CNM 06/20/2020, 8:42 AM   ----- 08/18/2020  Certified Nurse Midwife [facility]

## 2020-06-21 LAB — SURGICAL PATHOLOGY

## 2020-08-03 ENCOUNTER — Other Ambulatory Visit: Payer: Self-pay | Admitting: *Deleted

## 2020-08-03 DIAGNOSIS — E538 Deficiency of other specified B group vitamins: Secondary | ICD-10-CM

## 2020-08-03 DIAGNOSIS — D509 Iron deficiency anemia, unspecified: Secondary | ICD-10-CM

## 2020-08-07 ENCOUNTER — Other Ambulatory Visit: Payer: Self-pay

## 2020-08-09 ENCOUNTER — Other Ambulatory Visit: Payer: No Typology Code available for payment source

## 2020-08-09 ENCOUNTER — Ambulatory Visit: Payer: No Typology Code available for payment source | Admitting: Hematology and Oncology

## 2022-03-16 IMAGING — US US FETAL BPP W/ NON-STRESS
1 series · 14 of 24 positions shown · non-contrast
Comparison: none

CLINICAL DATA: 34-year-old pregnant female with decreased fetal
movement in third trimester.

EXAM:
LIMITED OBSTETRIC ULTRASOUND AND BIOPHYSICAL PROFILE

[Series 1: general · 24 acquisitions, 14 frames shown]
[im 1/24]
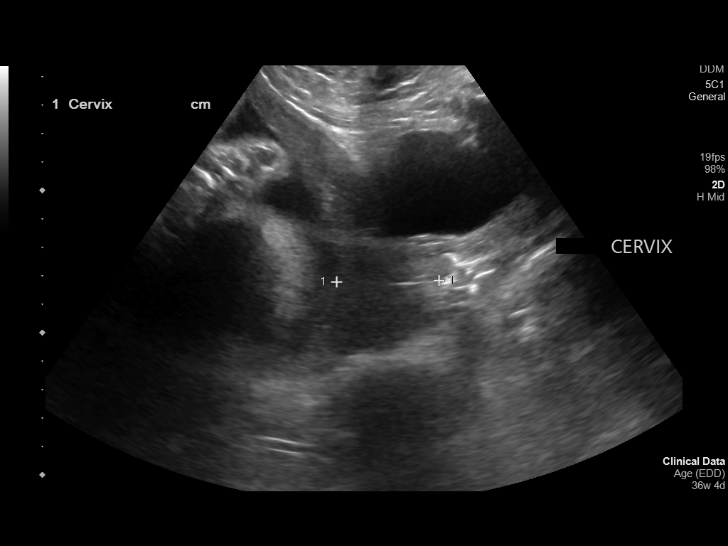
[im 3/24]
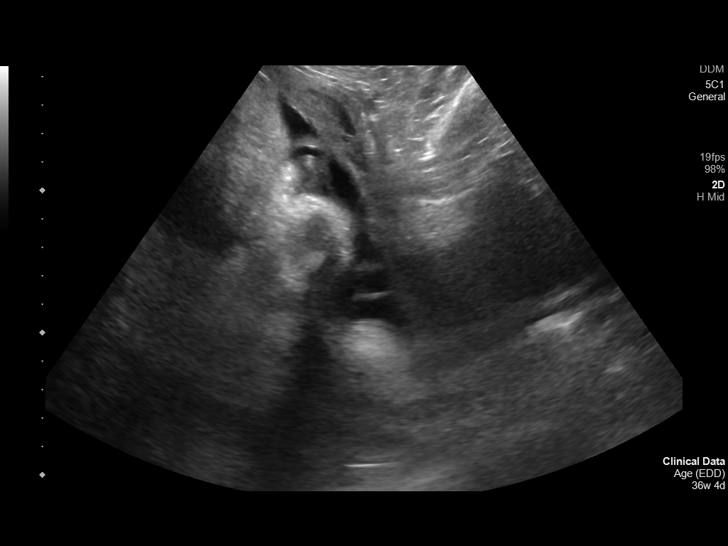
[im 5/24]
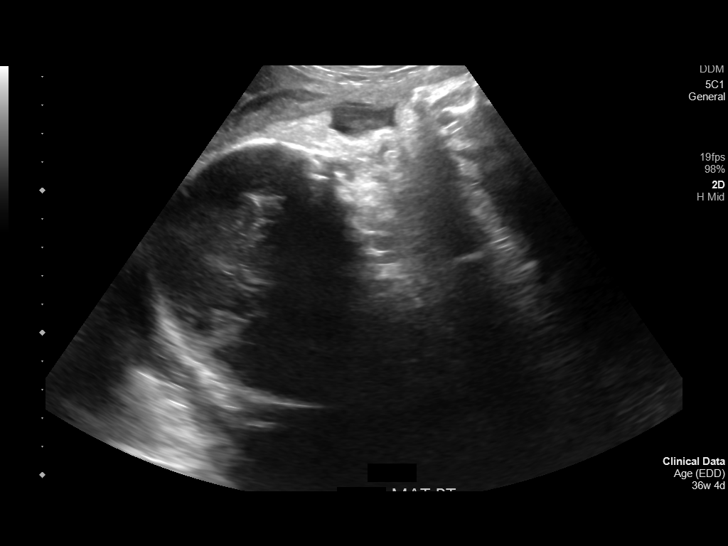
[im 7/24]
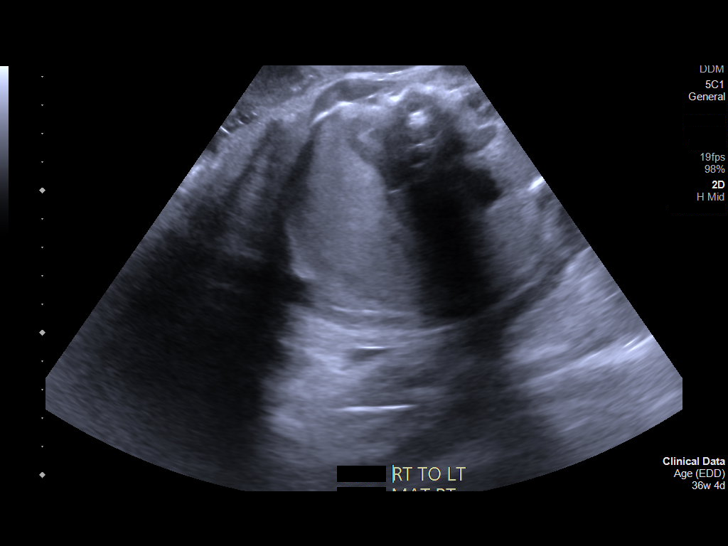
[im 8/24]
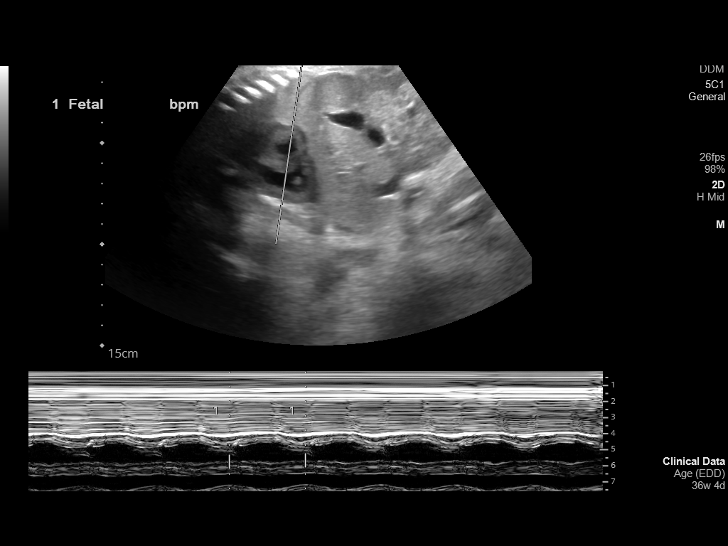
[im 10/24]
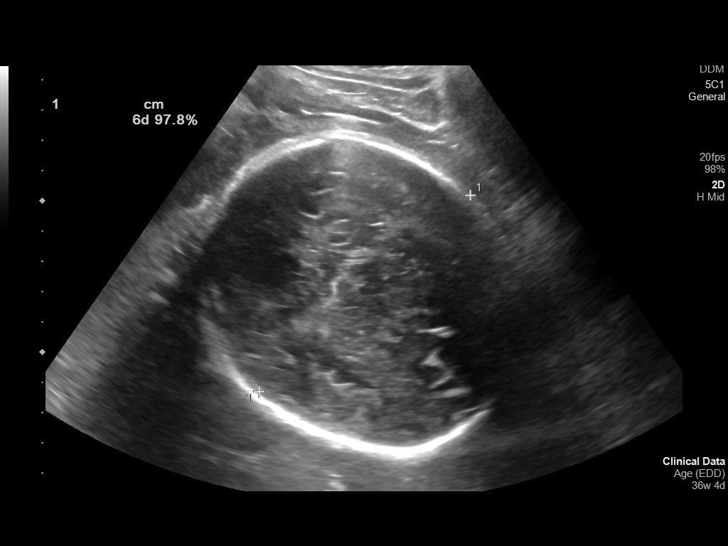
[im 12/24]
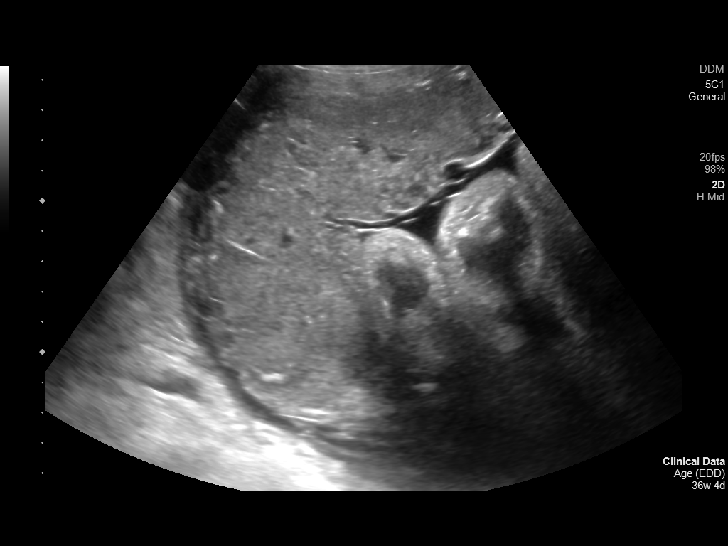
[im 13/24]
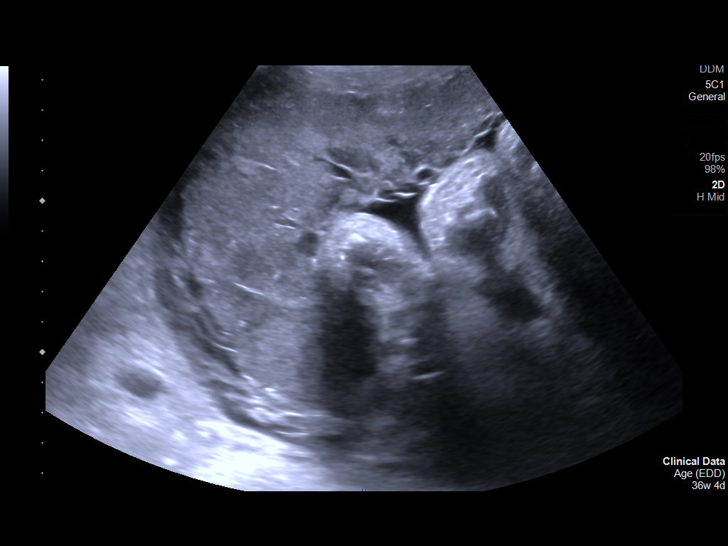
[im 15/24]
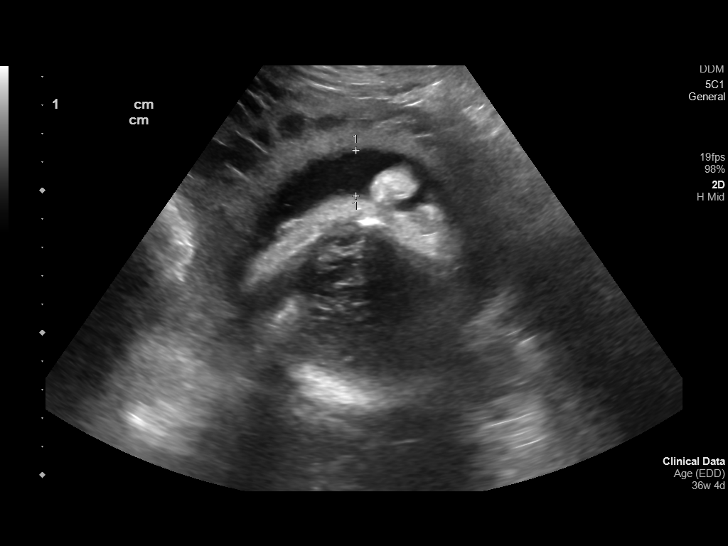
[im 17/24]
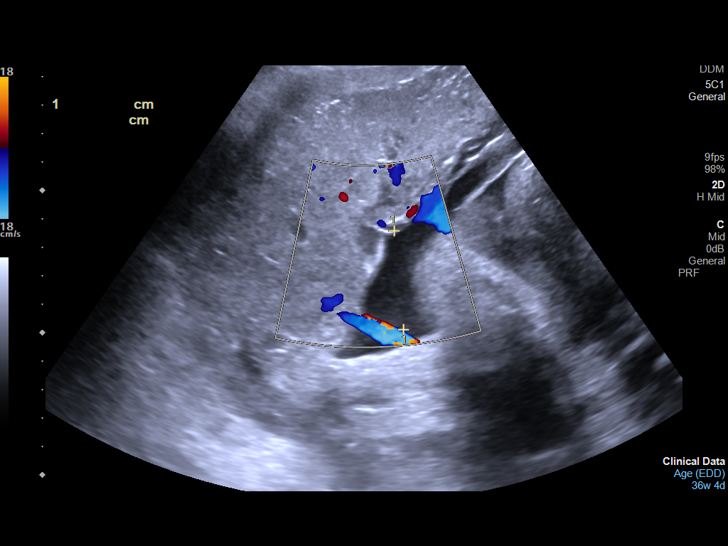
[im 19/24]
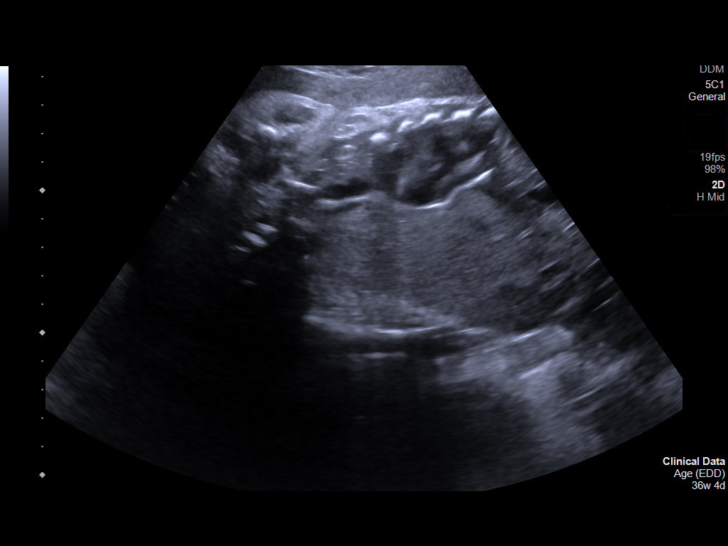
[im 20/24]
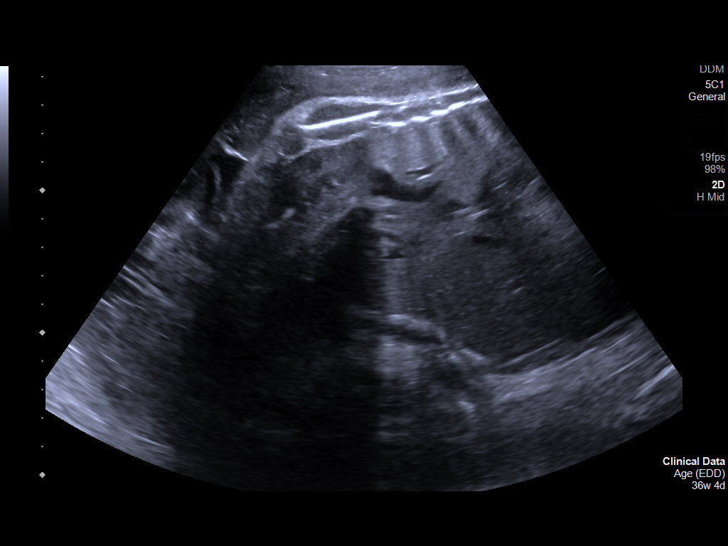
[im 22/24]
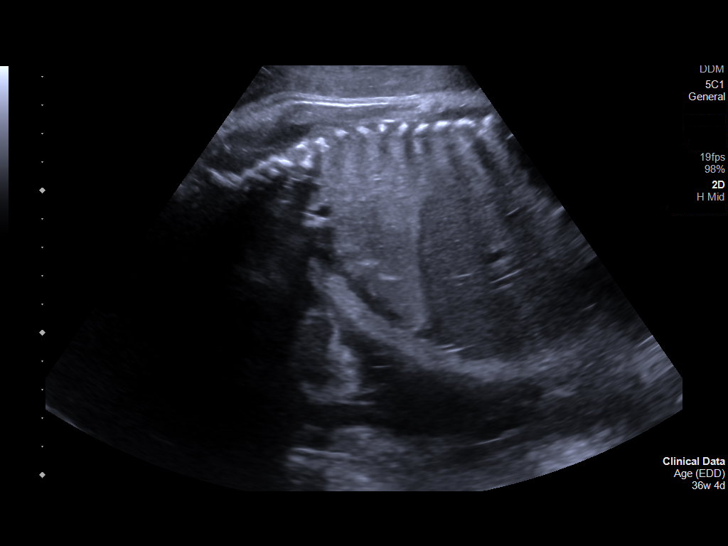
[im 24/24]
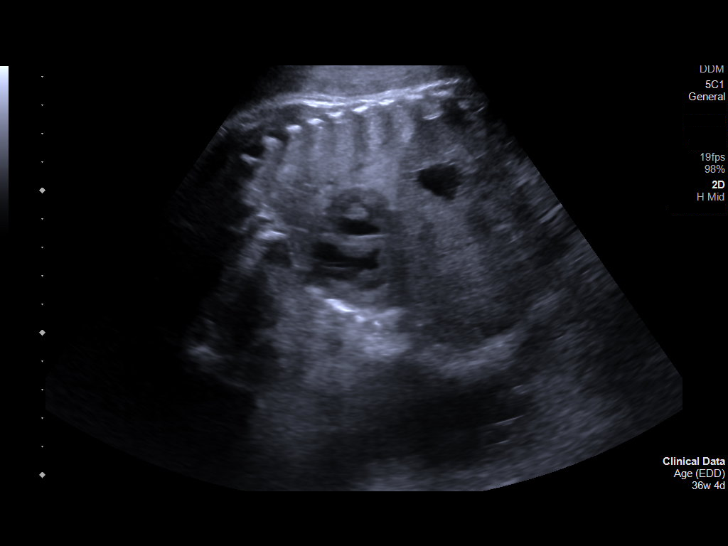

[14 of 24 positions shown; findings below may reference images not displayed]

FINDINGS: Number of Fetuses: 1

Heart Rate:  135 bpm

Movement: Yes

Presentation: Transverse with fetal head on maternal

Placental Location: Anterior fundal

Previa: No

Amniotic Fluid (Subjective):  Within normal limits.

AFI: 9.0 cm (normal AFI range 7.7 to 24.9 for 36 week gestational
age)

BPD:  9.5cm 38w 4d

MATERNAL FINDINGS:

Cervix: Appears closed. Cervix length approximately 3.6 cm on
transabdominal views.

Uterus/Adnexae: No abnormality visualized.

Movement:  2  Time: 26 minutes

Breathing: 2

Tone:  2

Amniotic Fluid: 2

Total Score:  8
IMPRESSION: 1. Single living intrauterine gestation in transverse lie at 38
weeks 4 days by limited fetal biometry. No acute gestational
abnormality demonstrated on this limited scan.
2. Biophysical profile score is [DATE].

This exam is performed on an emergent basis and does not
comprehensively evaluate fetal size, dating, or anatomy; follow-up
complete OB US should be considered if further fetal assessment is
warranted.

## 2022-10-03 NOTE — Telephone Encounter (Signed)
Signing note, See previous encounter on 05/15/20 

## 2023-12-06 ENCOUNTER — Ambulatory Visit
Admission: RE | Admit: 2023-12-06 | Discharge: 2023-12-06 | Disposition: A | Discharge status: Home / Self Care | Attending: Nurse Practitioner

## 2023-12-06 VITALS — BP 128/72 | HR 101 | Temp 98.0°F | Resp 18

## 2023-12-06 DIAGNOSIS — R053 Chronic cough: Secondary | ICD-10-CM

## 2023-12-06 DIAGNOSIS — Z8709 Personal history of other diseases of the respiratory system: Secondary | ICD-10-CM | POA: Diagnosis not present

## 2023-12-06 DIAGNOSIS — R0982 Postnasal drip: Secondary | ICD-10-CM | POA: Diagnosis not present

## 2023-12-06 MED ORDER — FLUTICASONE PROPIONATE 50 MCG/ACT NA SUSP: 2.0000 | Freq: Every day | NASAL | 0 refills | Status: AC

## 2023-12-06 MED ORDER — OMEPRAZOLE 20 MG PO CPDR: 20.0000 mg | DELAYED_RELEASE_CAPSULE | Freq: Every day | ORAL | 0 refills | Status: AC

## 2023-12-06 MED ORDER — ALBUTEROL SULFATE HFA 108 (90 BASE) MCG/ACT IN AERS: 1.0000 | INHALATION_SPRAY | Freq: Four times a day (QID) | RESPIRATORY_TRACT | 0 refills | Status: AC | PRN

## 2023-12-06 MED ORDER — BUDESONIDE 90 MCG/ACT IN AEPB: 1.0000 | INHALATION_SPRAY | Freq: Two times a day (BID) | RESPIRATORY_TRACT | 0 refills | Status: AC

## 2023-12-06 MED ORDER — LORATADINE 10 MG PO TABS: 10.0000 mg | ORAL_TABLET | Freq: Every day | ORAL | 0 refills | Status: AC

## 2023-12-06 NOTE — Discharge Instructions (Signed)
 You were seen today for a chronic cough that has been ongoing for several years and has worsened recently, causing chest pain and occasional vomiting after coughing. Your cough may be related to asthma, post-nasal drainage or acid reflux. You were prescribed budesonide  to use daily for asthma control, Flonase  and Claritin  to help reduce post-nasal drip, omeprazole  for acid reflux and an albuterol  inhaler to use as needed for sudden coughing fits or breathing symptoms. It is important to take these medications consistently each day, even when you are feeling better, as this will help reduce airway inflammation and prevent flare-ups. Using a cool-mist humidifier at night and staying well-hydrated can also help soothe your airways. Try to avoid irritants such as strong odors, smoke, or dust, and rest as needed if coughing fits cause fatigue. You should keep your appointment with your primary care provider and request to be placed on a cancellation list in case an earlier visit becomes available. Let your provider know about all the symptoms you are experiencing, including how well the prescribed treatments are working. If your cough does not improve or worsens despite treatment, your primary care provider may refer you to a lung specialist for further evaluation. Go to the emergency department if you experience severe shortness of breath, chest pain not related to coughing, coughing up blood, dizziness, or inability to catch your breath.

## 2023-12-06 NOTE — ED Provider Notes (Signed)
 [facility]AL[facility]EL[facility]    CSN: [id number] Arrival date & time: 12/06/23  1319      History   Chief Complaint Chief Complaint  Patient presents with   Cough    Entered by patient    HPI Jennifer Drake is a 38 y.o. female.   Discussed the use of AI scribe software for clinical note transcription with the patient, who gave verbal consent to proceed.   The patient presents with a chronic cough that has been ongoing for years, with a history of asthma diagnosed last year and post-nasal drip. The cough has worsened this summer, with severe coughing fits.  The patient reports coughing every day, with recent episodes causing significant chest pain and vomiting after coughing fits. The cough is exacerbated by physical activity, such as walking or doing household chores. The patient is unsure if wheezing is present but mentions the possibility of a cough variant asthma. The persistent cough is affecting sleep, leading to constant fatigue. The patient denies smoking, vaping, or exposure to secondhand smoke. They also deny experiencing acid reflux, but note that coughing after eating can cause regurgitation. The patient has attempted to use cough drops for relief, but found them ineffective. They express uncertainty about whether the cough is related to asthma or post-nasal drip.  The patient denies having an inhaler currently or taking any regular medications for their symptoms. They also deny experiencing fevers or shortness of breath. Patient has an appointment with PCP scheduled for sometime in September.  The following portions of the patient's history were reviewed and updated as appropriate: allergies, current medications, past family history, past medical history, past social history, past surgical history, and problem list.    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 Authorizing Provider  albuterol  (VENTOLIN  HFA) 108 (90 Base) MCG/ACT inhaler Inhale 1-2 puffs into the lungs every 6 (six) hours as needed for wheezing or shortness of breath. 12/06/23  Yes Iola Lukes, FNP  Budesonide  90 MCG/ACT inhaler Inhale 1 puff into the lungs 2 (two) times daily. Rinse mouth with water after use 12/06/23  Yes Zahmir Lalla, [place], FNP  fluticasone  (FLONASE ) 50 MCG/ACT nasal spray Place 2 sprays into both nostrils daily. Shake  well before use. Gently blow nose before spraying. Do not blow nose immediately after use. You should not taste the medication or feel it going down your throat; if you do, adjust your technique. 12/06/23  Yes Abrianna Sidman, FNP  loratadine  (CLARITIN ) 10 MG tablet Take 1 tablet (10 mg total) by mouth daily. 12/06/23  Yes Iola Lukes, FNP  omeprazole  (PRILOSEC) 20 MG capsule Take 1 capsule (20 mg total) by mouth daily. 12/06/23  Yes Marl Seago, FNP  acetaminophen  (TYLENOL ) 500 MG tablet Take 2 tablets (1,000 mg total) by mouth every 6 (six) hours as needed. 06/20/20   Vernel Therisa HERO,  CNM  Cyanocobalamin  (VITAMIN B 12 PO) Take by mouth.    [provider]  cyclobenzaprine  (FLEXERIL ) 10 MG tablet Take 10 mg by mouth 3 (three) times daily as needed for muscle spasms. Patient not taking: Reported on 06/11/2020    [provider]  docusate sodium  (COLACE) 100 MG capsule Take 100 mg by mouth 2 (two) times daily.    [provider]  ferrous sulfate  325 (65 FE) MG tablet Take 325 mg by mouth daily with breakfast.    [provider]  ibuprofen  (ADVIL ) 800 MG tablet Take 1 tablet (800 mg total) by mouth every 6 (six) hours as needed. 06/20/20   Vernel Therisa HERO, CNM  Melatonin 3 MG TABS Take by mouth. Patient not taking: Reported on 06/11/2020    [provider]  Prenatal Vit-Fe Fumarate-FA (PRENATAL VITAMINS PO) Take by mouth.    [provider]    Family History Family History  Problem Relation Age of Onset   Glaucoma Mother    Hypertension Father    Gout Father    Cancer Father     Social History Social History   Tobacco Use   Smoking status: Never   Smokeless tobacco: Never  Vaping Use   Vaping status: Never Used  Substance Use Topics   Alcohol use: Not Currently    Comment: ocassional    Drug use: Never     Allergies   Patient has no known allergies.   Review of Systems Review of Systems  Constitutional:  Positive for fatigue. Negative for appetite change, chills, diaphoresis and fever.  HENT:  Positive for postnasal drip. Negative for congestion, rhinorrhea, sneezing and sore throat.   Respiratory:  Positive for cough and shortness of breath. Negative for wheezing.   Cardiovascular:  Negative for palpitations and leg swelling.  Gastrointestinal:  Positive for vomiting. Negative for nausea.  Musculoskeletal:  Negative for myalgias.  Neurological:  Negative for headaches.  All other systems reviewed and are negative.    Physical Exam Triage Vital Signs ED Triage Vitals [12/06/23 1354]  Encounter  Vitals Group     BP 128/72     Girls Systolic BP Percentile      Girls Diastolic BP Percentile      Boys Systolic BP Percentile      Boys Diastolic BP Percentile      Pulse Rate (!) 101     Resp 18     Temp 98 F (36.7 C)     Temp src      SpO2 100 %     Weight      Height      Head Circumference      Peak Flow      Pain Score      Pain Loc      Pain Education      Exclude from  Growth Chart    No data found.  Updated Vital Signs BP 128/72   Pulse (!) 101   Temp 98 F (36.7 C)   Resp 18   SpO2 100%   Visual Acuity Right Eye Distance:   Left Eye Distance:   Bilateral Distance:    Right Eye Near:   Left Eye Near:    Bilateral Near:     Physical Exam Vitals reviewed.  Constitutional:      General: She is awake. She is not in acute distress.    Appearance: Normal appearance. She is well-developed. She is not ill-appearing, toxic-appearing or diaphoretic.  HENT:     Head: Normocephalic.     Right Ear: Hearing normal.     Left Ear: Hearing normal.     Nose: Nose normal.     Mouth/Throat:     Mouth: Mucous membranes are moist.  Eyes:     General: Vision grossly intact.     Conjunctiva/sclera: Conjunctivae normal.  Cardiovascular:     Rate and Rhythm: Normal rate and regular rhythm.     Heart sounds: Normal heart sounds.  Pulmonary:     Effort: Pulmonary effort is normal.     Breath sounds: Normal breath sounds and air entry.  Musculoskeletal:        General: Normal range of motion.     Cervical back: Full passive range of motion without pain, normal range of motion and neck supple.  Skin:    General: Skin is warm and dry.  Neurological:     General: No focal deficit present.     Mental Status: She is alert and oriented to person, place, and time.  Psychiatric:        Speech: Speech normal.        Behavior: Behavior is cooperative.      UC Treatments / Results  Labs (all labs ordered are listed, but only abnormal results are displayed) Labs  Reviewed - No data to display  EKG   Radiology No results found.  Procedures Procedures (including critical care time)  Medications Ordered in UC Medications - No data to display  Initial Impression / Assessment and Plan / UC Course  I have reviewed the triage vital signs and the nursing notes.  Pertinent labs & imaging results that were available during my care of the patient were reviewed by me and considered in my medical decision making (see chart for details).     Patient presents with a chronic cough that has persisted for several years and has worsened significantly this summer, with severe coughing fits leading to chest pain and post-cough emesis. Associated symptoms include fatigue and sleep disturbance. The patient has a prior diagnosis of asthma and a history of post-nasal drip but is not currently using inhalers or other medications. There is no history of smoking, vaping, or exposure to secondhand smoke. While acid reflux is denied, the patient notes occasional regurgitation triggered by coughing. Oxygen saturation is 100%, and lung and heart exams are normal, with no peripheral edema. Budesonide  was prescribed for maintenance asthma control, along with Flonase  and Claritin  for post-nasal drip management. An albuterol  inhaler was prescribed for acute symptom relief. The patient was counseled on the importance of consistent daily use of all prescribed medications. Follow-up with the primary care provider was strongly recommended, including requesting to be placed on the cancellation list for an earlier appointment. The patient was advised to report all symptoms discussed today and any changes or improvements from the  treatment. If symptoms do not improve, referral to a pulmonologist should be considered through the primary care provider. Patient was instructed to go to the emergency department if symptoms significantly worsen or become unmanageable.  Today's evaluation has revealed  no signs of a dangerous process. Discussed diagnosis with patient and/or guardian. Patient and/or guardian aware of their diagnosis, possible red flag symptoms to watch out for and need for close follow up. Patient and/or guardian understands verbal and written discharge instructions. Patient and/or guardian comfortable with plan and disposition.  Patient and/or guardian has a clear mental status at this time, good insight into illness (after discussion and teaching) and has clear judgment to make decisions regarding their care  Documentation was completed with the aid of voice recognition software. Transcription may contain typographical errors. Final Clinical Impressions(s) / UC Diagnoses   Final diagnoses:  Chronic cough  Post-nasal drainage  History of asthma     Discharge Instructions      You were seen today for a chronic cough that has been ongoing for several years and has worsened recently, causing chest pain and occasional vomiting after coughing. Your cough may be related to asthma, post-nasal drainage or acid reflux. You were prescribed budesonide  to use daily for asthma control, Flonase  and Claritin  to help reduce post-nasal drip, omeprazole  for acid reflux and an albuterol  inhaler to use as needed for sudden coughing fits or breathing symptoms. It is important to take these medications consistently each day, even when you are feeling better, as this will help reduce airway inflammation and prevent flare-ups. Using a cool-mist humidifier at night and staying well-hydrated can also help soothe your airways. Try to avoid irritants such as strong odors, smoke, or dust, and rest as needed if coughing fits cause fatigue. You should keep your appointment with your primary care provider and request to be placed on a cancellation list in case an earlier visit becomes available. Let your provider know about all the symptoms you are experiencing, including how well the prescribed treatments are  working. If your cough does not improve or worsens despite treatment, your primary care provider may refer you to a lung specialist for further evaluation. Go to the emergency department if you experience severe shortness of breath, chest pain not related to coughing, coughing up blood, dizziness, or inability to catch your breath.      ED Prescriptions     Medication Sig Dispense Auth. Provider   omeprazole  (PRILOSEC) 20 MG capsule Take 1 capsule (20 mg total) by mouth daily. 30 capsule Iola Lukes, FNP   loratadine  (CLARITIN ) 10 MG tablet Take 1 tablet (10 mg total) by mouth daily. 30 tablet Iola Lukes, FNP   fluticasone  (FLONASE ) 50 MCG/ACT nasal spray Place 2 sprays into both nostrils daily. Shake well before use. Gently blow nose before spraying. Do not blow nose immediately after use. You should not taste the medication or feel it going down your throat; if you do, adjust your technique. 16 g Iola Lukes, FNP   Budesonide  90 MCG/ACT inhaler Inhale 1 puff into the lungs 2 (two) times daily. Rinse mouth with water after use 1 each Iola Lukes, FNP   albuterol  (VENTOLIN  HFA) 108 (90 Base) MCG/ACT inhaler Inhale 1-2 puffs into the lungs every 6 (six) hours as needed for wheezing or shortness of breath. 18 g Iola Lukes, FNP      PDMP not reviewed this encounter.   Iola Lukes, [place] 12/06/23 1432
